# Patient Record
Sex: Female | Born: 1980 | Race: White | Hispanic: No | Marital: Married | Smoking: Never smoker
Health system: Southern US, Community
[De-identification: ages and names within clinical notes are randomized; demographics above are authoritative.]

## PROBLEM LIST (undated history)

## (undated) DIAGNOSIS — K5790 Diverticulosis of intestine, part unspecified, without perforation or abscess without bleeding: Secondary | ICD-10-CM

## (undated) DIAGNOSIS — G43909 Migraine, unspecified, not intractable, without status migrainosus: Secondary | ICD-10-CM

## (undated) DIAGNOSIS — J329 Chronic sinusitis, unspecified: Secondary | ICD-10-CM

## (undated) DIAGNOSIS — K219 Gastro-esophageal reflux disease without esophagitis: Secondary | ICD-10-CM

## (undated) DIAGNOSIS — F909 Attention-deficit hyperactivity disorder, unspecified type: Secondary | ICD-10-CM

## (undated) HISTORY — DX: Attention-deficit hyperactivity disorder, unspecified type: F90.9

## (undated) HISTORY — DX: Gastro-esophageal reflux disease without esophagitis: K21.9

## (undated) HISTORY — DX: Migraine, unspecified, not intractable, without status migrainosus: G43.909

## (undated) HISTORY — DX: Diverticulosis of intestine, part unspecified, without perforation or abscess without bleeding: K57.90

## (undated) HISTORY — PX: SINUS IRRIGATION: SHX2411

## (undated) HISTORY — PX: ABLATION: SHX5711

## (undated) HISTORY — PX: NASAL SEPTUM SURGERY: SHX37

## (undated) HISTORY — PX: REFRACTIVE SURGERY: SHX103

## (undated) HISTORY — DX: Chronic sinusitis, unspecified: J32.9

---

## 2004-04-12 ENCOUNTER — Ambulatory Visit: Payer: Self-pay | Admitting: Endocrinology

## 2004-04-15 ENCOUNTER — Ambulatory Visit: Payer: Self-pay | Admitting: Internal Medicine

## 2004-05-15 ENCOUNTER — Ambulatory Visit: Payer: Self-pay | Admitting: Internal Medicine

## 2004-06-12 ENCOUNTER — Ambulatory Visit: Payer: Self-pay | Admitting: Internal Medicine

## 2004-06-19 ENCOUNTER — Other Ambulatory Visit: Admission: RE | Admit: 2004-06-19 | Discharge: 2004-06-19 | Payer: Self-pay | Admitting: Gynecology

## 2004-06-27 ENCOUNTER — Ambulatory Visit: Payer: Self-pay | Admitting: Internal Medicine

## 2004-06-28 ENCOUNTER — Encounter: Admission: RE | Admit: 2004-06-28 | Discharge: 2004-06-28 | Payer: Self-pay | Admitting: Internal Medicine

## 2004-07-01 ENCOUNTER — Ambulatory Visit: Payer: Self-pay | Admitting: Internal Medicine

## 2004-07-08 ENCOUNTER — Ambulatory Visit: Payer: Self-pay | Admitting: Internal Medicine

## 2004-12-31 ENCOUNTER — Ambulatory Visit: Payer: Self-pay | Admitting: Endocrinology

## 2005-07-08 ENCOUNTER — Other Ambulatory Visit: Admission: RE | Admit: 2005-07-08 | Discharge: 2005-07-08 | Payer: Self-pay | Admitting: Gynecology

## 2006-07-10 ENCOUNTER — Other Ambulatory Visit: Admission: RE | Admit: 2006-07-10 | Discharge: 2006-07-10 | Payer: Self-pay | Admitting: Gynecology

## 2006-09-13 ENCOUNTER — Emergency Department (HOSPITAL_COMMUNITY): Admission: EM | Admit: 2006-09-13 | Discharge: 2006-09-13 | Payer: Self-pay | Admitting: *Deleted

## 2006-09-13 ENCOUNTER — Emergency Department (HOSPITAL_COMMUNITY): Admission: EM | Admit: 2006-09-13 | Discharge: 2006-09-14 | Payer: Self-pay | Admitting: Emergency Medicine

## 2006-12-12 ENCOUNTER — Emergency Department (HOSPITAL_COMMUNITY): Admission: EM | Admit: 2006-12-12 | Discharge: 2006-12-12 | Payer: Self-pay | Admitting: Emergency Medicine

## 2007-03-18 ENCOUNTER — Inpatient Hospital Stay (HOSPITAL_COMMUNITY): Admission: AD | Admit: 2007-03-18 | Discharge: 2007-03-18 | Payer: Self-pay | Admitting: Obstetrics

## 2007-07-07 ENCOUNTER — Inpatient Hospital Stay (HOSPITAL_COMMUNITY): Admission: AD | Admit: 2007-07-07 | Discharge: 2007-07-10 | Payer: Self-pay | Admitting: *Deleted

## 2007-09-05 ENCOUNTER — Emergency Department (HOSPITAL_COMMUNITY): Admission: EM | Admit: 2007-09-05 | Discharge: 2007-09-05 | Payer: Self-pay | Admitting: Emergency Medicine

## 2009-04-19 ENCOUNTER — Inpatient Hospital Stay (HOSPITAL_COMMUNITY): Admission: AD | Admit: 2009-04-19 | Discharge: 2009-04-19 | Payer: Self-pay | Admitting: Obstetrics and Gynecology

## 2009-04-23 ENCOUNTER — Inpatient Hospital Stay (HOSPITAL_COMMUNITY): Admission: AD | Admit: 2009-04-23 | Discharge: 2009-04-25 | Payer: Self-pay | Admitting: Obstetrics & Gynecology

## 2009-05-06 ENCOUNTER — Inpatient Hospital Stay (HOSPITAL_COMMUNITY): Admission: AD | Admit: 2009-05-06 | Discharge: 2009-05-06 | Payer: Self-pay | Admitting: Obstetrics & Gynecology

## 2010-09-11 LAB — RPR: RPR Ser Ql: NONREACTIVE

## 2010-09-11 LAB — RH IMMUNE GLOB WKUP(>/=20WKS)(NOT WOMEN'S HOSP): Fetal Screen: NEGATIVE

## 2010-09-11 LAB — CBC
HCT: 34 % — ABNORMAL LOW (ref 36.0–46.0)
HCT: 36.2 % (ref 36.0–46.0)
Hemoglobin: 11.5 g/dL — ABNORMAL LOW (ref 12.0–15.0)
Hemoglobin: 12.2 g/dL (ref 12.0–15.0)
MCHC: 33.7 g/dL (ref 30.0–36.0)
MCHC: 33.7 g/dL (ref 30.0–36.0)
MCV: 91.8 fL (ref 78.0–100.0)
MCV: 92.9 fL (ref 78.0–100.0)
Platelets: 184 10*3/uL (ref 150–400)
Platelets: 173 10*3/uL (ref 150–400)
RBC: 3.71 MIL/uL — ABNORMAL LOW (ref 3.87–5.11)
RBC: 3.9 MIL/uL (ref 3.87–5.11)
RDW: 14.3 % (ref 11.5–15.5)
RDW: 14 % (ref 11.5–15.5)
WBC: 13.1 10*3/uL — ABNORMAL HIGH (ref 4.0–10.5)
WBC: 11.8 10*3/uL — ABNORMAL HIGH (ref 4.0–10.5)

## 2010-10-22 NOTE — H&P (Signed)
NAMESHERBY, MONCAYO                ACCOUNT NO.:  [id number]   MEDICAL RECORD NO.:  [id number]          PATIENT TYPE:  MAT   LOCATION:  [facility]                          FACILITY:  [facility]   PHYSICIAN:  Lenoard Aden, M.D.DATE OF BIRTH:  11-17-1980   DATE OF ADMISSION:  DATE OF DISCHARGE:                              HISTORY & PHYSICAL   CHIEF COMPLAINT:  Post dates, for cervical ripening and induction.   Patient is a 30 year old white female, G1, P0, at [redacted] weeks gestation for  cervical ripening and induction.   She has no known latex allergy.  She is allergic to PENICILLIN.   She is a nonsmoker, nondrinker.  Denies domestic or physical violence.   History of medications to include prenatal vitamins and Zegerid.   FAMILY HISTORY:  Hyperthyroidism, migraine.  A personal history of  migraine headaches and gastroesophageal reflux, otherwise uncomplicated.   PHYSICAL EXAMINATION:  She is a well-developed and well-nourished white  female in no acute distress.  HEENT:  Normal.  LUNGS:  Clear.  HEART:  Regular rate and rhythm.  ABDOMEN:  Soft.  Gravid.  Nontender.  Estimated fetal weight 7-1/2  pounds.  PELVIC:  Cervix is 2-3 cm, 70% vertex, -1.  EXTREMITIES:  No cords.  NEUROLOGIC:  Nonfocal.  SKIN:  Intact.   IMPRESSION:  A 41-week intrauterine pregnancy, for cervical ripening and  induction.   PLAN:  Placement of Cervidil.  This is reactive.  NST anticipated,  vaginal delivery.      Lenoard Aden, M.D.  Electronically Signed     RJT/MEDQ  D:  07/07/2007  T:  07/07/2007  Job:  [id number]

## 2011-02-27 LAB — CBC
HCT: 32.1 — ABNORMAL LOW
HCT: 38.7
Hemoglobin: 11.1 — ABNORMAL LOW
Hemoglobin: 13.3
MCHC: 34.4
MCHC: 34.4
MCV: 91
MCV: 92.6
Platelets: 165
Platelets: 202
RBC: 3.47 — ABNORMAL LOW
RBC: 4.25
RDW: 14.2
RDW: 14.4
WBC: 11.5 — ABNORMAL HIGH
WBC: 12.6 — ABNORMAL HIGH

## 2011-02-27 LAB — RH IMMUNE GLOB WKUP(>/=20WKS)(NOT WOMEN'S HOSP): Fetal Screen: NEGATIVE

## 2011-02-27 LAB — RPR: RPR Ser Ql: NONREACTIVE

## 2011-03-18 ENCOUNTER — Encounter: Payer: Self-pay | Admitting: Cardiology

## 2011-03-19 ENCOUNTER — Encounter: Payer: Self-pay | Admitting: Cardiology

## 2011-03-19 ENCOUNTER — Ambulatory Visit (INDEPENDENT_AMBULATORY_CARE_PROVIDER_SITE_OTHER): Payer: 59 | Admitting: Cardiology

## 2011-03-19 DIAGNOSIS — R079 Chest pain, unspecified: Secondary | ICD-10-CM | POA: Insufficient documentation

## 2011-03-19 DIAGNOSIS — R0989 Other specified symptoms and signs involving the circulatory and respiratory systems: Secondary | ICD-10-CM

## 2011-03-19 DIAGNOSIS — R072 Precordial pain: Secondary | ICD-10-CM

## 2011-03-19 DIAGNOSIS — R0609 Other forms of dyspnea: Secondary | ICD-10-CM

## 2011-03-19 DIAGNOSIS — J45998 Other asthma: Secondary | ICD-10-CM | POA: Insufficient documentation

## 2011-03-19 DIAGNOSIS — R0600 Dyspnea, unspecified: Secondary | ICD-10-CM

## 2011-03-19 NOTE — Patient Instructions (Signed)
Your physician has requested that you have an echocardiogram. Echocardiography is a painless test that uses sound waves to create images of your heart. It provides your doctor with information about the size and shape of your heart and how well your heart's chambers and valves are working. This procedure takes approximately one hour. There are no restrictions for this procedure.   

## 2011-03-19 NOTE — Progress Notes (Signed)
HPI: 30 year old female with no prior cardiac history for evaluation for chest pain. Patient states that for the past one month she has had increased dyspnea on exertion. No orthopnea, PND or pedal edema. She also notes chest pain. It is substernal in location with no associated symptoms. It can last hours to 2 days. It is described as an ache. It increases with inspiration and cough. It is not exertional. No relation to food. Resolved spontaneously. She has been seen and diagnosed with asthma. We are asked to evaluate for potential cardiac contribution.  Current Outpatient Prescriptions  Medication Sig Dispense Refill  . cholecalciferol (VITAMIN D) 1000 UNITS tablet Take 1,000 Units by mouth daily.        . Multiple Vitamin (MULTIVITAMIN) capsule Take 1 capsule by mouth daily.        Marland Kitchen PROAIR HFA 108 (90 BASE) MCG/ACT inhaler as needed.      . Probiotic Product (PROBIOTIC FORMULA PO) Take 1 tablet by mouth daily.          Allergies  Allergen Reactions  . Penicillins     Past Medical History  Diagnosis Date  . Asthma   . GERD (gastroesophageal reflux disease)     No past surgical history on file.  History   Social History  . Marital Status: Married    Spouse Name: N/A    Number of Children: 2  . 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 Smoking status: Never Smoker   . Smokeless tobacco: Not on file  . Alcohol Use: Yes     Rarely  . Drug Use: No  . Sexually Active: Not on file   Other Topics Concern  . Not on file   Social History Narrative  . No narrative on file    Family History  Problem Relation Age of Onset  . Hypertension      ROS: no fevers or chills, productive cough, hemoptysis, dysphasia, odynophagia, melena, hematochezia, dysuria, hematuria, rash, seizure activity, orthopnea, PND, pedal edema, claudication. Remaining systems are negative.  Physical Exam: General:   Well developed/well nourished in NAD Skin warm/dry Patient not depressed No peripheral clubbing Back-normal HEENT-normal/normal eyelids Neck supple/normal carotid upstroke bilaterally; no bruits; no JVD; no thyromegaly chest - diffuse expiratory wheeze CV - RRR/normal S1 and S2; no murmurs, rubs or gallops;  PMI nondisplaced; no change with valsalva. Abdomen -NT/ND, no HSM, no mass, + bowel sounds, no bruit 2+ femoral pulses, no bruits Ext-no edema, chords, 2+ DP Neuro-grossly nonfocal  ECG 03/17/11 NSR with no ST changes.

## 2011-03-19 NOTE — Assessment & Plan Note (Signed)
Patient with expiratory wheeze on examination. Dyspnea most likely secondary to asthma. Management per primary care.

## 2011-03-19 NOTE — Assessment & Plan Note (Addendum)
Patient's symptoms are atypical and unlikely to be cardiac related. Her electrocardiogram is normal. Her chest pain can last 2 days at a time. Her chest tightness may be related to her asthma. Will arrange echocardiogram to assess LV function. If normal no further cardiac workup. Note no risk factors for pulmonary embolus including no recent travel or leg trauma.

## 2011-03-20 LAB — URINALYSIS, ROUTINE W REFLEX MICROSCOPIC
Bilirubin Urine: NEGATIVE
Glucose, UA: NEGATIVE
Hgb urine dipstick: NEGATIVE
Ketones, ur: NEGATIVE
Nitrite: NEGATIVE
Protein, ur: NEGATIVE
Specific Gravity, Urine: 1.02
Urobilinogen, UA: 0.2
pH: 6.5

## 2011-03-20 LAB — URINE MICROSCOPIC-ADD ON

## 2011-03-20 LAB — WET PREP, GENITAL
Clue Cells Wet Prep HPF POC: NONE SEEN
Trich, Wet Prep: NONE SEEN
Yeast Wet Prep HPF POC: NONE SEEN

## 2011-03-20 LAB — CBC
HCT: 33.8 — ABNORMAL LOW
Hemoglobin: 11.8 — ABNORMAL LOW
MCHC: 35
MCV: 90.8
Platelets: 222
RBC: 3.73 — ABNORMAL LOW
RDW: 13.6
WBC: 12 — ABNORMAL HIGH

## 2011-03-20 LAB — BASIC METABOLIC PANEL
BUN: 4 — ABNORMAL LOW
CO2: 27
Calcium: 9.1
Chloride: 105
Creatinine, Ser: 0.49
GFR calc Af Amer: >60
GFR calc non Af Amer: >60
Glucose, Bld: 88
Potassium: 3.8
Sodium: 137

## 2011-03-20 LAB — URINE CULTURE: Colony Count: 9000

## 2011-03-25 ENCOUNTER — Telehealth: Payer: Self-pay | Admitting: Cardiology

## 2011-03-25 NOTE — Telephone Encounter (Signed)
Patient called to cancelled the echo. Echo is off schedule per Melissa.

## 2011-03-25 NOTE — Telephone Encounter (Signed)
Pt called to cxl echo

## 2011-03-26 ENCOUNTER — Other Ambulatory Visit (HOSPITAL_COMMUNITY): Payer: 59 | Admitting: Radiology

## 2011-04-03 ENCOUNTER — Ambulatory Visit (INDEPENDENT_AMBULATORY_CARE_PROVIDER_SITE_OTHER): Payer: 59 | Admitting: Internal Medicine

## 2011-04-03 ENCOUNTER — Encounter: Payer: Self-pay | Admitting: Internal Medicine

## 2011-04-03 VITALS — BP 110/82 | HR 76 | Temp 98.0°F | Ht 66.0 in | Wt 136.0 lb

## 2011-04-03 DIAGNOSIS — R0609 Other forms of dyspnea: Secondary | ICD-10-CM

## 2011-04-03 DIAGNOSIS — R0600 Dyspnea, unspecified: Secondary | ICD-10-CM

## 2011-04-03 DIAGNOSIS — R0989 Other specified symptoms and signs involving the circulatory and respiratory systems: Secondary | ICD-10-CM

## 2011-04-03 MED ORDER — ESOMEPRAZOLE MAGNESIUM 40 MG PO CPDR: 40.0000 mg | DELAYED_RELEASE_CAPSULE | Freq: Every day | ORAL | Status: DC

## 2011-04-03 NOTE — Patient Instructions (Signed)
Work on inhaler technique:  relax and gently blow all the way out then take a nice smooth deep breath back in, triggering the inhaler at same time you start breathing in.  Hold for up to 5 seconds if you can.  Rinse and gargle with water when done   If your mouth or throat starts to bother you,   I suggest you time the inhaler to your dental care and after using the inhaler(s) brush teeth and tongue with a baking soda containing toothpaste and when you rinse this out, gargle with it first to see if this helps your mouth and throat.     Add nexium Take 30-60 min before first meal of the day and continue zegrid at bedtime  GERD (REFLUX)  is an extremely common cause of respiratory symptoms, many times with no significant heartburn at all.    It can be treated with medication, but also with lifestyle changes including avoidance of late meals, excessive alcohol, smoking cessation, and avoid fatty foods, chocolate, peppermint, colas, red wine, and acidic juices such as orange juice.  NO MINT OR MENTHOL PRODUCTS SO NO COUGH DROPS  USE SUGARLESS CANDY INSTEAD (jolley ranchers or Stover's)  NO OIL BASED VITAMINS - use powdered substitutes.    Please see patient coordinator before you leave today  to schedule methacholine test

## 2011-04-03 NOTE — Progress Notes (Signed)
  Subjective:    Patient ID: Rachel Ferrell, female    DOB: June 08, 1981, 30 y.o.   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 Cc new onset variably exertional but sometimes resting dyspnea x 3 months indolent onset, mod severe and now persistent daily  assoc with chest tightness worse with activity  but partially improved p proaire eval by Cyndia Bent then cards >  ok by Dr Antoine Poche.  c/o draingage sensation  is year round worse in am's but doesn't wake her up on Zegrid at hs only.  Only very small amt green mucus ever produced, mostly first thing  in am.   Sleeping ok without nocturnal  or premature am awakening or  exacerbation  of respiratory  c/o's or need for noct saba. Also denies any obvious fluctuation of symptoms with weather or environmental changes or other aggravating or alleviating factors except as outlined above   Review of Systems  Constitutional: Negative for fever, chills and unexpected weight change.  HENT: Positive for sore throat. Negative for ear pain, nosebleeds, congestion, rhinorrhea, sneezing, trouble swallowing, dental problem, voice change, postnasal drip and sinus pressure.   Eyes: Negative for visual disturbance.  Respiratory: Positive for cough and shortness of breath. Negative for choking.   Cardiovascular: Positive for chest pain. Negative for leg swelling.  Gastrointestinal: Negative for vomiting, abdominal pain and diarrhea.  Genitourinary: Negative for difficulty urinating.  Musculoskeletal: Negative for arthralgias.  Skin: Negative for rash.  Neurological: Positive for  headaches. Negative for tremors and syncope.  Hematological: Does not bruise/bleed easily.       Objective:   Physical Exam  amb wf nad  Wt 136 04/03/2011  HEENT: nl dentition, turbinates, and orophanx. Nl external ear canals without cough reflex   NECK :  without JVD/Nodes/TM/ nl carotid upstrokes bilaterally   LUNGS: no acc muscle use, clear to A and P bilaterally without cough on insp or exp maneuvers   CV:  RRR  no s3 or murmur or increase in P2, no edema   ABD:  soft and nontender with nl excursion in the supine position. No bruits or organomegaly, bowel sounds nl  MS:  warm without deformities, calf tenderness, cyanosis or clubbing  SKIN: warm and dry without lesions    NEURO:  alert, approp, no deficits        Assessment & Plan:

## 2011-04-03 NOTE — Assessment & Plan Note (Signed)
DDX of  difficult airways managment all start with A and  include Adherence, Ace Inhibitors, Acid Reflux, Active Sinus Disease, Alpha 1 Antitripsin deficiency, Anxiety masquerading as Airways dz,  ABPA,  allergy(esp in young), Aspiration (esp in elderly), Adverse effects of DPI,  Active smokers, plus two Bs  = Bronchiectasis and Beta blocker use..and one C= CHF  Acid and non acid reflux remain at the top of the list for her as she had it before with net ent eval x for this.  Discussed max acid suppression.  Explained natural history of uri and why it's necessary in patients at risk to treat GERD aggressively  at least  short term   to reduce risk of evolving cyclical cough initially  triggered by epithelial injury and a heightened sensitivty to the effects of any upper airway irritants,  most importantly acid - related.  That is, the more sensitive the epithelium damaged for virus, the more the cough, the more the secondary reflux (especially in those prone to reflux) the more the irritation of the sensitive mucosa and so on in a cyclical pattern.   Next step is methacholine challenge then perhaps add 1st gen H1 per guidelines  The proper method of use, as well as anticipated side effects, of this metered-dose inhaler are discussed and demonstrated to the patient. Improved only to 25% with coaching so not a great candidate for trial of hfa ICS and likely will cough worse with DPI so need to sort out whether astma really present or not before proceeding further.

## 2011-04-08 ENCOUNTER — Telehealth: Payer: Self-pay | Admitting: Internal Medicine

## 2011-04-08 NOTE — Telephone Encounter (Signed)
nexium is not covered by insurance. What alternative will work for the pt?  She is currently on zegerid at bedtime.  Please advise. Carron Curie, CMA

## 2011-04-08 NOTE — Telephone Encounter (Signed)
zegrid is fine but needs to be dosed 40 mg twice daily or generic protonix 40 mg can be substituted for the am nexim

## 2011-04-09 NOTE — Telephone Encounter (Signed)
Called and spoke with pharmacist and advised of recs per Dr Sherene Sires. She tried running both pantoprazole and zegerid 40 bid and also omeprazole. She states that she is going to refax PA to triage faxline so we can try and get nexium approved. Will await fax.

## 2011-04-10 MED ORDER — PANTOPRAZOLE SODIUM 40 MG PO TBEC: DELAYED_RELEASE_TABLET | ORAL | Status: DC

## 2011-04-10 NOTE — Telephone Encounter (Signed)
Yes, ok to use zegrid if 100% better to her satisfaction

## 2011-04-10 NOTE — Telephone Encounter (Signed)
Received PA form from [employer]([phone]- called to initiate PA form nexium. Spoke with Amy and was advised that nexium is not covered but also does not require a PA. She states that pantoprazole and omeprazole are covered for once day dosing. I will call in rx for pantoprazole 40 mg every am per MW recs. Spoke with pt and advised of this and she states that she has actually been taking her otc 20 mg zegerid in the am and pm and this has helped a lot so wants to know if okay to just continue on this. Please advise, thanks!

## 2011-04-10 NOTE — Telephone Encounter (Signed)
Spoke with pt and notified of recs per MW. She states that after thinking about it, taking zegerid otc bid may become too expensive and so therefore does want the rx for pantoprazole sent. Rx was sent to pharm.

## 2011-04-16 ENCOUNTER — Other Ambulatory Visit (HOSPITAL_COMMUNITY): Payer: Self-pay | Admitting: Radiology

## 2011-04-17 ENCOUNTER — Other Ambulatory Visit (HOSPITAL_COMMUNITY): Payer: Self-pay | Admitting: Radiology

## 2011-04-17 ENCOUNTER — Ambulatory Visit (HOSPITAL_COMMUNITY)
Admission: RE | Admit: 2011-04-17 | Discharge: 2011-04-17 | Disposition: A | Discharge status: Home / Self Care | Payer: 59 | Source: Ambulatory Visit | Attending: Internal Medicine | Admitting: Internal Medicine

## 2011-04-17 DIAGNOSIS — R0602 Shortness of breath: Secondary | ICD-10-CM | POA: Insufficient documentation

## 2011-04-17 DIAGNOSIS — R0600 Dyspnea, unspecified: Secondary | ICD-10-CM

## 2011-04-17 LAB — PULMONARY FUNCTION TEST

## 2011-04-17 MED ORDER — METHACHOLINE 16 MG/ML NEB SOLN: 3.0000 mL | Freq: Once | RESPIRATORY_TRACT | Status: DC

## 2011-04-17 MED ORDER — METHACHOLINE 0.25 MG/ML NEB SOLN: 3.0000 mL | Freq: Once | RESPIRATORY_TRACT | Status: DC

## 2011-04-17 MED ORDER — SODIUM CHLORIDE 0.9 % IN NEBU
3.0000 mL | INHALATION_SOLUTION | Freq: Once | RESPIRATORY_TRACT | Status: AC
  Administered 2011-04-17: 3 mL via RESPIRATORY_TRACT

## 2011-04-17 MED ORDER — METHACHOLINE 1 MG/ML NEB SOLN: 3.0000 mL | Freq: Once | RESPIRATORY_TRACT | Status: DC

## 2011-04-17 MED ORDER — ALBUTEROL SULFATE (5 MG/ML) 0.5% IN NEBU: 2.5000 mg | INHALATION_SOLUTION | Freq: Once | RESPIRATORY_TRACT | Status: DC

## 2011-04-17 MED ORDER — METHACHOLINE 0.0625 MG/ML NEB SOLN: 3.0000 mL | Freq: Once | RESPIRATORY_TRACT | Status: DC

## 2011-04-17 MED ORDER — METHACHOLINE 4 MG/ML NEB SOLN: 3.0000 mL | Freq: Once | RESPIRATORY_TRACT | Status: DC

## 2011-04-17 NOTE — Progress Notes (Signed)
Pt. Given methacholine pre protocol. Unable to chart and scan against due to not being in [facility].

## 2011-04-23 MED FILL — Methacholine Chloride Inhal For Soln 100 MG: RESPIRATORY_TRACT | Qty: 2 | Status: AC

## 2011-04-23 MED FILL — Methacholine Chloride Inhal For Soln 100 MG: RESPIRATORY_TRACT | Qty: 100 | Status: AC

## 2011-05-02 ENCOUNTER — Ambulatory Visit (HOSPITAL_COMMUNITY): Payer: 59 | Attending: Cardiology | Admitting: Radiology

## 2011-05-02 DIAGNOSIS — R072 Precordial pain: Secondary | ICD-10-CM

## 2011-05-02 DIAGNOSIS — R0989 Other specified symptoms and signs involving the circulatory and respiratory systems: Secondary | ICD-10-CM

## 2011-05-02 DIAGNOSIS — I079 Rheumatic tricuspid valve disease, unspecified: Secondary | ICD-10-CM | POA: Insufficient documentation

## 2011-05-02 DIAGNOSIS — R0609 Other forms of dyspnea: Secondary | ICD-10-CM | POA: Insufficient documentation

## 2011-05-02 DIAGNOSIS — I379 Nonrheumatic pulmonary valve disorder, unspecified: Secondary | ICD-10-CM | POA: Insufficient documentation

## 2011-05-02 DIAGNOSIS — I059 Rheumatic mitral valve disease, unspecified: Secondary | ICD-10-CM | POA: Insufficient documentation

## 2011-05-04 ENCOUNTER — Encounter (HOSPITAL_COMMUNITY): Payer: Self-pay | Admitting: *Deleted

## 2011-05-04 ENCOUNTER — Emergency Department (HOSPITAL_COMMUNITY)
Admission: EM | Admit: 2011-05-04 | Discharge: 2011-05-04 | Disposition: A | Discharge status: Home / Self Care | Payer: 59 | Attending: Emergency Medicine | Admitting: Emergency Medicine

## 2011-05-04 ENCOUNTER — Emergency Department (HOSPITAL_COMMUNITY): Payer: 59

## 2011-05-04 DIAGNOSIS — J45909 Unspecified asthma, uncomplicated: Secondary | ICD-10-CM | POA: Insufficient documentation

## 2011-05-04 DIAGNOSIS — R0602 Shortness of breath: Secondary | ICD-10-CM | POA: Insufficient documentation

## 2011-05-04 DIAGNOSIS — R079 Chest pain, unspecified: Secondary | ICD-10-CM | POA: Insufficient documentation

## 2011-05-04 MED ORDER — PREDNISONE 20 MG PO TABS: 60.0000 mg | ORAL_TABLET | Freq: Every day | ORAL | Status: AC

## 2011-05-04 MED ORDER — ALBUTEROL SULFATE (5 MG/ML) 0.5% IN NEBU
5.0000 mg | INHALATION_SOLUTION | Freq: Once | RESPIRATORY_TRACT | Status: AC
  Administered 2011-05-04: 5 mg via RESPIRATORY_TRACT

## 2011-05-04 MED ORDER — ACETAMINOPHEN-CODEINE 120-12 MG/5ML PO SUSP: 5.0000 mL | Freq: Four times a day (QID) | ORAL | Status: AC | PRN

## 2011-05-04 MED ORDER — PREDNISONE 20 MG PO TABS
60.0000 mg | ORAL_TABLET | Freq: Once | ORAL | Status: AC
  Administered 2011-05-04: 60 mg via ORAL
  Filled 2011-05-04: qty 3

## 2011-05-04 MED ORDER — ALBUTEROL SULFATE (5 MG/ML) 0.5% IN NEBU
2.5000 mg | INHALATION_SOLUTION | Freq: Once | RESPIRATORY_TRACT | Status: DC
  Filled 2011-05-04: qty 1

## 2011-05-04 MED ORDER — ALBUTEROL SULFATE (5 MG/ML) 0.5% IN NEBU
2.5000 mg | INHALATION_SOLUTION | Freq: Once | RESPIRATORY_TRACT | Status: AC
  Administered 2011-05-04: 2.5 mg via RESPIRATORY_TRACT
  Filled 2011-05-04: qty 0.5

## 2011-05-04 NOTE — ED Provider Notes (Signed)
History     CSN: [id number] Arrival date & time: 05/04/2011 12:50 AM   First MD Initiated Contact with Patient 05/04/11 0126      Chief Complaint  Patient presents with  . Cough  . Shortness of Breath    (Consider location/radiation/quality/duration/timing/severity/associated sxs/prior treatment) Patient is a 30 y.o. female presenting with cough and shortness of breath. The history is provided by the patient.  Cough This is a new problem. The current episode started 3 to 5 hours ago. The problem occurs constantly. The problem has been gradually worsening. The cough is non-productive. There has been no fever. Associated symptoms include chest pain, shortness of breath and wheezing. Pertinent negatives include no chills, no ear congestion, no ear pain, no headaches and no eye redness. Treatments tried: Albuterol inhaler. The treatment provided mild relief.  Shortness of Breath  Associated symptoms include chest pain, cough, shortness of breath and wheezing. Pertinent negatives include no fever.   patient with history of asthma followed by Dr. Sherene Sires with pulmonary. She also has reflux disease that she believes may be attributing to her symptoms. Today she is having worsening shortness of breath and wheezing with chest burning and tightness. No fevers or chills. No known sick contacts. No recent travel. No change in medications. Moderate in severity. No aggravating or alleviating factors otherwise.  Past Medical History  Diagnosis Date  . Asthma   . GERD (gastroesophageal reflux disease)   . GERD (gastroesophageal reflux disease)     History reviewed. No pertinent past surgical history.  Family History  Problem Relation Age of Onset  . Hypertension      History  Substance Use Topics  . Smoking status: Never Smoker   . Smokeless tobacco: Never Used  . Alcohol Use: No    OB History    Grav Para Term Preterm Abortions TAB SAB Ect Mult Living                  Review of  Systems  Constitutional: Negative for fever and chills.  HENT: Negative for ear pain, neck pain and neck stiffness.   Eyes: Negative for pain and redness.  Respiratory: Positive for cough, shortness of breath and wheezing.   Cardiovascular: Positive for chest pain.  Gastrointestinal: Negative for abdominal pain.  Genitourinary: Negative for dysuria.  Musculoskeletal: Negative for back pain.  Skin: Negative for rash.  Neurological: Negative for headaches.  All other systems reviewed and are negative.    Allergies  Penicillins  Home Medications   Current Outpatient Rx  Name Route Sig Dispense Refill  . VITAMIN D 1000 UNITS PO TABS Oral Take 1,000 Units by mouth daily.      . MULTIVITAMINS PO CAPS Oral Take 1 capsule by mouth daily.      Marland Kitchen OMEPRAZOLE-SODIUM BICARBONATE 20-1100 MG PO CAPS Oral Take 1 capsule by mouth at bedtime.      Marland Kitchen PROAIR HFA 108 (90 BASE) MCG/ACT IN AERS  2 puffs every 6 (six) hours as needed.     Marland Kitchen PROBIOTIC FORMULA PO Oral Take 1 tablet by mouth daily.      Marland Kitchen ESOMEPRAZOLE MAGNESIUM 40 MG PO CPDR Oral Take 1 capsule (40 mg total) by mouth daily. 30 capsule 1  . PANTOPRAZOLE SODIUM 40 MG PO TBEC  Take 1 tablet by mouth 30-60 minutes before first meal of the day 30 tablet 5    BP 104/62  Pulse 61  Temp(Src) 98.2 F (36.8 C) (Oral)  Resp 16  SpO2 100%  LMP 04/13/2011  Physical Exam  Constitutional: She is oriented to person, place, and time. She appears well-developed and well-nourished.  HENT:  Head: Normocephalic and atraumatic.  Eyes: Conjunctivae and EOM are normal. Pupils are equal, round, and reactive to light.  Neck: Trachea normal. Neck supple. No thyromegaly present.  Cardiovascular: Normal rate, regular rhythm, S1 normal, S2 normal and normal pulses.     No systolic murmur is present   No diastolic murmur is present  Pulses:      Radial pulses are 2+ on the right side, and 2+ on the left side.  Pulmonary/Chest: Effort normal and breath  sounds normal. She has no rhonchi.       No respiratory distress, has mild expiratory wheezes with good air movement  Abdominal: Soft. Normal appearance and bowel sounds are normal. There is no tenderness. There is no CVA tenderness and negative Murphy's sign.  Musculoskeletal:       BLE:s Calves nontender, no cords or erythema, negative Homans sign  Neurological: She is alert and oriented to person, place, and time. She has normal strength. No cranial nerve deficit or sensory deficit. GCS eye subscore is 4. GCS verbal subscore is 5. GCS motor subscore is 6.  Skin: Skin is warm and dry. No rash noted. She is not diaphoretic.  Psychiatric: Her speech is normal.       Cooperative and appropriate    ED Course  Procedures (including critical care time)  Labs Reviewed - No data to display Dg Chest 2 View  05/04/2011  *RADIOLOGY REPORT*  Clinical Data: Shortness of breath and cough; history of asthma.  CHEST - 2 VIEW  Comparison: None.  Findings: The lungs are well-aerated.  Peribronchial thickening is noted.  There is no evidence of focal opacification, pleural effusion or pneumothorax.  The heart is normal in size; the mediastinal contour is within normal limits.  No acute osseous abnormalities are seen.  IMPRESSION: Peribronchial thickening noted; lungs otherwise clear.  Original Report Authenticated By: Tonia Ghent, M.D.    By mouth prednisone and albuterol nebulizer provided. On recheck still has some mild wheezing and repeat albuterol nebulizers given. Recheck at 4:45 AM patient to much better is requesting discharge home. She has close followup with Dr. Sherene Sires agrees to strict return precautions and followup instructions   MDM   Wheezing with history of asthma improved with albuterol nebulizers and prednisone. Chest x-ray reviewed as above. Symptoms may be exacerbated by mild URI symptoms. Tylenol with codeine prescribed in addition to prednisone. Patient has an annular at home and is  stable for discharge        Sunnie Nielsen, MD 05/04/11 [phone]

## 2011-05-07 ENCOUNTER — Telehealth: Payer: Self-pay | Admitting: Internal Medicine

## 2011-05-07 DIAGNOSIS — R0600 Dyspnea, unspecified: Secondary | ICD-10-CM

## 2011-05-07 NOTE — Telephone Encounter (Signed)
Verlon Au, can you please track down these results for MW.  Thanks.

## 2011-05-07 NOTE — Telephone Encounter (Signed)
Do not see the completed study in the emr

## 2011-05-07 NOTE — Telephone Encounter (Signed)
I spoke with pt and is requesting her Methacholine challenge results from 04/17/11. Please advise Dr. Sherene Sires, thanks

## 2011-05-07 NOTE — Telephone Encounter (Signed)
Called and LMOVM requesting that this study be faxed to triage. Will await results.

## 2011-05-08 NOTE — Telephone Encounter (Signed)
Received results and put in MW's look at stack

## 2011-05-09 NOTE — Telephone Encounter (Signed)
Positive for asthma so next step is qvar 40 rx (or we can give her samples of the 80 to try if we don't have the 40) and use Take 2 puffs first thing in am and then another 2 puffs about 12 hours later.    x 2 weeks then ov to regroup

## 2011-05-09 NOTE — Telephone Encounter (Signed)
Called and spoke with pt and notified of results of MCT. I advised will give samples of qvar 80 (no 40 available) and should take 2 puffs first thing in the am and then 2 puffs about 12 hours later, rinse mouth after using. Pt verbalized understanding and has sched 2 wk rov with MW 05/23/11 at 9 am.

## 2011-05-12 ENCOUNTER — Telehealth: Payer: Self-pay | Admitting: Internal Medicine

## 2011-05-12 NOTE — Telephone Encounter (Signed)
Spoke with pt. She states that she forgot to mention last wk that she had just finished prednisone taper so she did not start inhaler yet. I advised that it is fine for her to start the inhaler after taking the prednisone and she verbalized understanding.

## 2011-05-23 ENCOUNTER — Ambulatory Visit (INDEPENDENT_AMBULATORY_CARE_PROVIDER_SITE_OTHER): Payer: 59 | Admitting: Internal Medicine

## 2011-05-23 ENCOUNTER — Encounter: Payer: Self-pay | Admitting: Internal Medicine

## 2011-05-23 ENCOUNTER — Telehealth: Payer: Self-pay | Admitting: Internal Medicine

## 2011-05-23 VITALS — BP 90/60 | HR 69 | Temp 98.2°F | Ht 66.0 in | Wt 140.2 lb

## 2011-05-23 DIAGNOSIS — R0600 Dyspnea, unspecified: Secondary | ICD-10-CM

## 2011-05-23 DIAGNOSIS — J069 Acute upper respiratory infection, unspecified: Secondary | ICD-10-CM

## 2011-05-23 DIAGNOSIS — J45909 Unspecified asthma, uncomplicated: Secondary | ICD-10-CM

## 2011-05-23 DIAGNOSIS — R0989 Other specified symptoms and signs involving the circulatory and respiratory systems: Secondary | ICD-10-CM

## 2011-05-23 MED ORDER — ESOMEPRAZOLE MAGNESIUM 40 MG PO CPDR: 40.0000 mg | DELAYED_RELEASE_CAPSULE | Freq: Every day | ORAL | Status: DC

## 2011-05-23 MED ORDER — TRAMADOL HCL 50 MG PO TABS
ORAL_TABLET | ORAL | Status: AC
Start: 2011-05-23 — End: 2011-06-02

## 2011-05-23 MED ORDER — PREDNISONE (PAK) 10 MG PO TABS: ORAL_TABLET | ORAL | Status: AC

## 2011-05-23 MED ORDER — OMEPRAZOLE-SODIUM BICARBONATE 20-1100 MG PO CAPS: ORAL_CAPSULE | ORAL | Status: DC

## 2011-05-23 MED ORDER — DOXYCYCLINE HYCLATE 100 MG PO TABS: 100.0000 mg | ORAL_TABLET | Freq: Two times a day (BID) | ORAL | Status: AC

## 2011-05-23 NOTE — Progress Notes (Signed)
  Subjective:    Patient ID: Rachel Ferrell, female    DOB: August 09, 1980, 30 y.o.   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 Cc new onset variably exertional but sometimes resting dyspnea x 3 months indolent onset, mod severe and now persistent daily  assoc with chest tightness worse with activity  but partially improved p proaire eval by Cyndia Bent then cards >  ok by Dr Antoine Poche.  c/o draingage sensation  is year round worse in am's but doesn't wake her up on Zegrid at hs only.  Only very small amt green mucus ever produced, mostly first thing  in am.  rec Work on inhaler technique:      Add nexium Take 30-60 min before first meal of the day and continue zegrid at bedtime GERD rx then MCT >  POSITIVE FOR ASTHMA,  Felt uncomfortable with breathing but zero reproduction of symptoms.  Started qvar 80 one bid ( samples) and cp seemed better, cough got worse > to ER rx with prednisone but no benefit from albuterol either.   05/23/2011 f/u ov/Dayvian Blixt cc persistent cough present daily worse when lie down but better p nyquil then cough a lot for the first hour of each am.  No sob ? Better on qvar.  Also denies any obvious fluctuation of symptoms with weather or environmental changes or other aggravating or alleviating factors except as outlined above.  ROS  At present neg for  any significant sore throat, dysphagia, itching, sneezing,  nasal congestion or excess/ purulent secretions,  fever, chills, sweats, unintended wt loss, pleuritic or exertional cp, hempoptysis, orthopnea pnd or leg swelling.  Also denies presyncope,  palpitations, heartburn, abdominal pain, nausea, vomiting, diarrhea  or change in bowel or urinary habits, dysuria,hematuria,  rash, arthralgias, visual complaints, headache, numbness weakness or ataxia.          Objective:   Physical Exam  amb wf nad  Wt 136 04/03/2011 > 05/23/2011  140   HEENT: nl dentition, turbinates, and orophanx. Nl external ear canals without cough reflex   NECK :  without JVD/Nodes/TM/ nl carotid upstrokes bilaterally   LUNGS: no acc muscle use, clear to A and P bilaterally without cough on insp or exp maneuvers   CV:  RRR  no s3 or murmur or increase in P2, no edema   ABD:  soft and nontender with nl excursion in the supine position. No bruits or organomegaly, bowel sounds nl  MS:  warm without deformities, calf tenderness, cyanosis or clubbing   cxr 05/04/11 Peribronchial thickening noted; lungs otherwise clear.       Assessment & Plan:

## 2011-05-23 NOTE — Telephone Encounter (Signed)
Has enough samples of nexium until next ov and then can address the issue of whether or not ppi is needed

## 2011-05-23 NOTE — Telephone Encounter (Signed)
MW---pts insurance does not cover the nexium and pt is requesting rx for pantoprazole to be sent to the pharmacy.  Pt stated that this was discussed at ov with am.. Please advise if ok to send in rx for pt.  thanks

## 2011-05-23 NOTE — Patient Instructions (Addendum)
The key to effective treatment for your cough is eliminating the non-stop cycle of cough you're stuck in long enough to let your airway heal completely and then see if there is anything still making you cough once you stop the cough suppression, but this should take no more than 5 days to figuure out  First take delsym two tsp every 12 hours and supplement if needed with  tramadol 50 mg up to 2 every 4 hours to suppress the urge to cough. Swallowing water or using ice chips/non mint and menthol containing candies (such as lifesavers or sugarless jolly ranchers) are also effective.  You should rest your voice and avoid activities that you know make you cough.  Once you have eliminated the cough for 3 straight days try reducing the tramadol first,  then the delsym as tolerated.    Try Nexium 40mg    Take 30-60 min before first meal of the day and Zergerid 20 mg two bedtime until cough is completely gone for at least a week without the need for cough suppression  I think of reflux for chronic cough like I do oxygen for fire (doesn't cause the fire but once you get the oxygen suppressed it usually goes away regardless of the exact cause).  GERD (REFLUX)  is an extremely common cause of respiratory symptoms, many times with no significant heartburn at all.    It can be treated with medication, but also with lifestyle changes including avoidance of late meals, excessive alcohol, smoking cessation, and avoid fatty foods, chocolate, peppermint, colas, red wine, and acidic juices such as orange juice.  NO MINT OR MENTHOL PRODUCTS SO NO COUGH DROPS  USE SUGARLESS CANDY INSTEAD (jolley ranchers or Stover's)  NO OIL BASED VITAMINS - use powdered substitutes.   Doxy x 7 days  Prednisone 10 mg take  4 each am x 2 days,   2 each am x 2 days,  1 each am x2days and stop     Please schedule a follow up office visit in 4 weeks, sooner if needed   Next steps sinus ct and allergy eval

## 2011-05-24 DIAGNOSIS — J069 Acute upper respiratory infection, unspecified: Secondary | ICD-10-CM | POA: Insufficient documentation

## 2011-05-24 NOTE — Assessment & Plan Note (Signed)
DDX of  difficult airways managment all start with A and  include Adherence, Ace Inhibitors, Acid Reflux, Active Sinus Disease, Alpha 1 Antitripsin deficiency, Anxiety masquerading as Airways dz,  ABPA,  allergy(esp in young), Aspiration (esp in elderly), Adverse effects of DPI,  Active smokers, plus two Bs  = Bronchiectasis and Beta blocker use..and one C= CHF  Adherence is always the initial "prime suspect" and is a multilayered concern that requires a "trust but verify" approach in every patient - starting with knowing how to use medications, especially inhalers, correctly, keeping up with refills and understanding the fundamental difference between maintenance and prns vs those medications only taken for a very short course and then stopped and not refilled. The proper method of use, as well as anticipated side effects, of this metered-dose inhaler are discussed and demonstrated to the patient. Improved from 25 to 90% with coaching  ? Acid reflux > continue max rx for now  ? Active sinus dz > sinus ct next  ? Allergy> allergy profile next

## 2011-05-24 NOTE — Assessment & Plan Note (Signed)
rx with doxy,  Short course prednisone only

## 2011-06-05 ENCOUNTER — Telehealth: Payer: Self-pay | Admitting: Internal Medicine

## 2011-06-05 NOTE — Telephone Encounter (Signed)
Pt's insurance will not cover Nexium or Zegerid. The pt must first try and fail all OTC medications:  Zegerid OTC, Omeprazole (Prilosec), Prevacid. Pls advise. Allergies  Allergen Reactions  . Penicillins

## 2011-06-05 NOTE — Telephone Encounter (Signed)
Try prilosec 20 mg x 2 each am and zegrid 20 mg x 2 each pm until next ov

## 2011-06-05 NOTE — Telephone Encounter (Addendum)
See other phone note dated today 

## 2011-06-05 NOTE — Telephone Encounter (Signed)
lmomtcb  

## 2011-06-11 NOTE — Telephone Encounter (Signed)
LMOMTCB x 1 

## 2011-06-12 NOTE — Telephone Encounter (Signed)
Pt does not want to keep paying out of pocket for medications due to cost. She says her insurance coverage has changed for the new year and she wants to continue on Nexium (she has samples) and Zegerid OTC until she has OV on 06/20/11 with Dr. Sherene Sires. Will forward to MW so he is aware and he and pt can discuss PPI regimen at that OV.

## 2011-06-12 NOTE — Telephone Encounter (Signed)
Pt returned triage's call & asked to be reached at [phone].  Rachel Ferrell

## 2011-06-20 ENCOUNTER — Ambulatory Visit: Payer: 59 | Admitting: Internal Medicine

## 2012-04-30 ENCOUNTER — Other Ambulatory Visit: Payer: Self-pay | Admitting: Internal Medicine

## 2012-04-30 ENCOUNTER — Ambulatory Visit
Admission: RE | Admit: 2012-04-30 | Discharge: 2012-04-30 | Disposition: A | Discharge status: Home / Self Care | Payer: 59 | Source: Ambulatory Visit | Attending: Internal Medicine | Admitting: Internal Medicine

## 2012-04-30 DIAGNOSIS — J411 Mucopurulent chronic bronchitis: Secondary | ICD-10-CM

## 2012-05-05 ENCOUNTER — Other Ambulatory Visit: Payer: 59

## 2012-05-17 ENCOUNTER — Other Ambulatory Visit: Payer: Self-pay | Admitting: Internal Medicine

## 2012-05-17 DIAGNOSIS — J329 Chronic sinusitis, unspecified: Secondary | ICD-10-CM

## 2012-05-20 ENCOUNTER — Ambulatory Visit
Admission: RE | Admit: 2012-05-20 | Discharge: 2012-05-20 | Disposition: A | Discharge status: Home / Self Care | Payer: 59 | Source: Ambulatory Visit | Attending: Internal Medicine | Admitting: Internal Medicine

## 2012-05-20 DIAGNOSIS — J329 Chronic sinusitis, unspecified: Secondary | ICD-10-CM

## 2012-08-20 ENCOUNTER — Other Ambulatory Visit: Payer: Self-pay | Admitting: Otolaryngology

## 2012-08-20 DIAGNOSIS — J342 Deviated nasal septum: Secondary | ICD-10-CM

## 2012-08-20 DIAGNOSIS — J329 Chronic sinusitis, unspecified: Secondary | ICD-10-CM

## 2012-08-31 ENCOUNTER — Other Ambulatory Visit: Payer: 59

## 2012-09-01 ENCOUNTER — Other Ambulatory Visit: Payer: 59

## 2013-01-30 ENCOUNTER — Encounter (HOSPITAL_COMMUNITY): Payer: Self-pay | Admitting: Physical Medicine and Rehabilitation

## 2013-01-30 ENCOUNTER — Emergency Department (HOSPITAL_COMMUNITY)
Admission: EM | Admit: 2013-01-30 | Discharge: 2013-01-30 | Disposition: A | Discharge status: Home / Self Care | Payer: 59 | Attending: Emergency Medicine | Admitting: Emergency Medicine

## 2013-01-30 DIAGNOSIS — J45901 Unspecified asthma with (acute) exacerbation: Secondary | ICD-10-CM | POA: Insufficient documentation

## 2013-01-30 DIAGNOSIS — Z3202 Encounter for pregnancy test, result negative: Secondary | ICD-10-CM | POA: Insufficient documentation

## 2013-01-30 DIAGNOSIS — K219 Gastro-esophageal reflux disease without esophagitis: Secondary | ICD-10-CM | POA: Insufficient documentation

## 2013-01-30 DIAGNOSIS — IMO0002 Reserved for concepts with insufficient information to code with codable children: Secondary | ICD-10-CM | POA: Insufficient documentation

## 2013-01-30 DIAGNOSIS — Z880 Allergy status to penicillin: Secondary | ICD-10-CM | POA: Insufficient documentation

## 2013-01-30 DIAGNOSIS — Z79899 Other long term (current) drug therapy: Secondary | ICD-10-CM | POA: Insufficient documentation

## 2013-01-30 DIAGNOSIS — R109 Unspecified abdominal pain: Secondary | ICD-10-CM | POA: Insufficient documentation

## 2013-01-30 DIAGNOSIS — R197 Diarrhea, unspecified: Secondary | ICD-10-CM | POA: Insufficient documentation

## 2013-01-30 LAB — URINALYSIS, ROUTINE W REFLEX MICROSCOPIC
Bilirubin Urine: NEGATIVE
Glucose, UA: NEGATIVE mg/dL
Hgb urine dipstick: NEGATIVE
Ketones, ur: NEGATIVE mg/dL
Leukocytes, UA: NEGATIVE
Nitrite: NEGATIVE
Protein, ur: NEGATIVE mg/dL
Specific Gravity, Urine: 1.008 (ref 1.005–1.030)
Urobilinogen, UA: 0.2 mg/dL (ref 0.0–1.0)
pH: 5.5 (ref 5.0–8.0)

## 2013-01-30 LAB — POCT PREGNANCY, URINE: Preg Test, Ur: NEGATIVE

## 2013-01-30 NOTE — ED Notes (Signed)
EDP spoke with patient about plan of care. She has no further questions at the time. To be discharged home and follow up with PCP if symptoms persist.

## 2013-01-30 NOTE — ED Notes (Signed)
Pt presents to department for evaluation of lower abdominal pain and diarrhea. Pt states symptoms intermittent since Wednesday. Now states she noticed mucus in stool this morning at church. 7/10 abdominal pain at the time. Denies urinary symptoms. Pt is alert and oriented x4.

## 2013-01-30 NOTE — ED Provider Notes (Signed)
CSN: [id number]     Arrival date & time 01/30/13  1478 History     First MD Initiated Contact with Patient 01/30/13 [phone]     Chief Complaint  Patient presents with  . Abdominal Pain  . Diarrhea   (Consider location/radiation/quality/duration/timing/severity/associated sxs/prior Treatment) Patient is a 32 y.o. female presenting with diarrhea. The history is provided by the patient.  Diarrhea Quality:  Mucous Severity:  Moderate Onset quality:  Gradual Number of episodes:  7-10 Duration:  4 days Timing:  Constant Progression:  Unchanged Relieved by:  Nothing Worsened by:  Nothing tried Associated symptoms: abdominal pain ( cramping lower abdominal pain)   Associated symptoms: no fever and no vomiting   Abdominal pain:    Location:  Suprapubic   Quality:  Cramping   Severity:  Moderate   Onset quality:  Gradual   Timing:  Intermittent   Progression:  Unchanged   Chronicity:  New   Past Medical History  Diagnosis Date  . Asthma   . GERD (gastroesophageal reflux disease)   . GERD (gastroesophageal reflux disease)    No past surgical history on file. Family History  Problem Relation Age of Onset  . Hypertension     History  Substance Use Topics  . Smoking status: Never Smoker   . Smokeless tobacco: Never Used  . Alcohol Use: No   OB History   Grav Para Term Preterm Abortions TAB SAB Ect Mult Living                 Review of Systems  Constitutional: Negative for fever.  HENT: Negative for congestion.   Respiratory: Negative for cough and shortness of breath.   Cardiovascular: Negative for chest pain.  Gastrointestinal: Positive for abdominal pain ( cramping lower abdominal pain) and diarrhea. Negative for nausea and vomiting.  All other systems reviewed and are negative.    Allergies  Penicillins  Home Medications   Current Outpatient Rx  Name  Route  Sig  Dispense  Refill  . beclomethasone (QVAR) 80 MCG/ACT inhaler   Inhalation   Inhale 1 puff  into the lungs 2 (two) times daily.           . cholecalciferol (VITAMIN D) 1000 UNITS tablet   Oral   Take 1,000 Units by mouth daily.           Marland Kitchen EXPIRED: esomeprazole (NEXIUM) 40 MG capsule   Oral   Take 1 capsule (40 mg total) by mouth daily.   30 capsule   1   . Multiple Vitamin (MULTIVITAMIN) capsule   Oral   Take 1 capsule by mouth daily.           Maxwell Caul Bicarbonate (ZEGERID) 20-1100 MG CAPS      2 at bedtime    28 each      . PROAIR HFA 108 (90 BASE) MCG/ACT inhaler      2 puffs every 6 (six) hours as needed.          . Probiotic Product (PROBIOTIC FORMULA PO)   Oral   Take 1 tablet by mouth daily.           . Pseudoeph-Doxylamine-DM-APAP (NYQUIL PO)      Per bottle directions as needed           BP 102/63  Pulse 70  Temp(Src) 97.1 F (36.2 C) (Oral)  Resp 18  SpO2 98% Physical Exam  Nursing note and vitals reviewed. Constitutional: She is oriented to person, place,  and time. She appears well-developed and well-nourished. No distress.  HENT:  Head: Normocephalic and atraumatic.  Mouth/Throat: Oropharynx is clear and moist.  Eyes: Conjunctivae are normal. Pupils are equal, round, and reactive to light. No scleral icterus.  Neck: Neck supple.  Cardiovascular: Normal rate, regular rhythm, normal heart sounds and intact distal pulses.   No murmur heard. Pulmonary/Chest: Effort normal. No stridor. No respiratory distress. She has wheezes (mild, expiratory, diffuse "I always have wheezing"). She has no rales.  Abdominal: Soft. Bowel sounds are normal. She exhibits no distension. There is tenderness in the right lower quadrant and left lower quadrant. There is no rigidity, no rebound and no guarding.  Musculoskeletal: Normal range of motion.  Neurological: She is alert and oriented to person, place, and time.  Skin: Skin is warm and dry. No rash noted.  Psychiatric: She has a normal mood and affect. Her behavior is normal.    ED  Course   Procedures (including critical care time)  Labs Reviewed  URINALYSIS, ROUTINE W REFLEX MICROSCOPIC  POCT PREGNANCY, URINE   No results found. 1. Diarrhea     MDM  32 year old female with 4 days of cramping lower abdominal pain and diarrhea. No vomiting. No fevers. Well-appearing, nontoxic, not dehydrated. Abdomen is soft, minimally tender in lower abdomen, no r/r/g.  Urinalysis normal.  U preg neg.  History and exam not c/w ov torsion, TOA, appendicitis, or bowel obstruction.  No vaginal symptoms or risk for STD.  Symptoms clearly GI in origin with diarrhea as predominant symptom.  No blood in stool.  Advised supportive supportive care and PCP follow up.  Return precautions given.  Pt in agreement with plan.    Candyce Churn, MD 01/30/13 [phone]

## 2014-08-04 ENCOUNTER — Encounter: Payer: Self-pay | Admitting: Podiatrist

## 2014-08-04 ENCOUNTER — Ambulatory Visit (INDEPENDENT_AMBULATORY_CARE_PROVIDER_SITE_OTHER): Payer: 59 | Admitting: Podiatrist

## 2014-08-04 VITALS — BP 142/86 | HR 60 | Resp 12

## 2014-08-04 DIAGNOSIS — Q828 Other specified congenital malformations of skin: Secondary | ICD-10-CM | POA: Diagnosis not present

## 2014-08-04 MED ORDER — DICLOFENAC SODIUM 3 % TD GEL: 1.0000 "application " | Freq: Every day | TRANSDERMAL | Status: DC

## 2014-08-04 NOTE — Progress Notes (Signed)
   Subjective:    Patient ID: Rachel Ferrell, female    DOB: May 05, 1981, 34 y.o.   MRN: [id number]  HPI PT STATED B/L BALL OF THE FOOT, LT GREAT TOE HAVE WARTS AND BEEN THERE FOR 1 YEAR. B/L FEET ARE GETTING WORSE AND PAINFUL ESPECIALLY WHEN WALKING. TRIED PLANTAR WART REMOVAL BUT NO HELP.   Review of Systems  All other systems reviewed and are negative.      Objective:   Physical Exam  Patient is awake, alert, and oriented x 3.  In no acute distress.  Vascular status is intact with palpable pedal pulses at 2/4 DP and PT bilateral and capillary refill time within normal limits. Neurological sensation is also intact bilaterally via Semmes Weinstein monofilament at 5/5 sites. Light touch, vibratory sensation, Achilles tendon reflex is intact. Dermatological exam reveals skin color, turger and texture as normal. No open lesions present.  Musculature intact with dorsiflexion, plantarflexion, inversion, eversion.  Multiple porokeratotic lesions present in a linear fashion along the natural crease between the first and second metatarsals plantarly.  Minimally enucleated with intact integument present. Skin tension lines are present.  No capillary budding present.  Finding is bilateral.      Assessment & Plan:  Porokeratotic lesions bilateral  Recommended solaraze gel for the porokeratotic lesions since they do appear small and superficial. Gave instructions for use.  At this time they are not painful. If they become painful she will call.

## 2014-08-29 ENCOUNTER — Telehealth: Payer: Self-pay | Admitting: *Deleted

## 2014-08-29 NOTE — Telephone Encounter (Signed)
I called to inform patient that Voltaren Gel was denied by her insurance.  I also faxed the denial to her pharmacy.  Dr. Valentina Lucks is going to order a compound cream for you.  You should receive it in the mail.  They may call you to verify insurance.  I just wanted to make you aware of what was going on.  "Okay, thank you."  Prescription was sent to [facility] in [place], [place]. Compound for Diclofenac 3% and Salicylic Acid  I left a message for them to call and confirm that they received the prescription and will be able to get prescription to patient.

## 2014-08-31 NOTE — Telephone Encounter (Signed)
"  We did receive the prescription and we will call the patient."

## 2015-07-05 ENCOUNTER — Emergency Department (HOSPITAL_COMMUNITY): Payer: 59

## 2015-07-05 ENCOUNTER — Encounter (HOSPITAL_COMMUNITY): Payer: Self-pay

## 2015-07-05 DIAGNOSIS — K219 Gastro-esophageal reflux disease without esophagitis: Secondary | ICD-10-CM | POA: Diagnosis not present

## 2015-07-05 DIAGNOSIS — Z3202 Encounter for pregnancy test, result negative: Secondary | ICD-10-CM | POA: Insufficient documentation

## 2015-07-05 DIAGNOSIS — Z79899 Other long term (current) drug therapy: Secondary | ICD-10-CM | POA: Insufficient documentation

## 2015-07-05 DIAGNOSIS — J159 Unspecified bacterial pneumonia: Secondary | ICD-10-CM | POA: Insufficient documentation

## 2015-07-05 DIAGNOSIS — Z7951 Long term (current) use of inhaled steroids: Secondary | ICD-10-CM | POA: Insufficient documentation

## 2015-07-05 DIAGNOSIS — R0602 Shortness of breath: Secondary | ICD-10-CM | POA: Diagnosis present

## 2015-07-05 DIAGNOSIS — Z880 Allergy status to penicillin: Secondary | ICD-10-CM | POA: Diagnosis not present

## 2015-07-05 DIAGNOSIS — J45901 Unspecified asthma with (acute) exacerbation: Secondary | ICD-10-CM | POA: Diagnosis not present

## 2015-07-05 LAB — CBC
HCT: 39 % (ref 36.0–46.0)
Hemoglobin: 13.4 g/dL (ref 12.0–15.0)
MCH: 30.3 pg (ref 26.0–34.0)
MCHC: 34.4 g/dL (ref 30.0–36.0)
MCV: 88.2 fL (ref 78.0–100.0)
Platelets: 193 10*3/uL (ref 150–400)
RBC: 4.42 MIL/uL (ref 3.87–5.11)
RDW: 13.5 % (ref 11.5–15.5)
WBC: 13.7 10*3/uL — ABNORMAL HIGH (ref 4.0–10.5)

## 2015-07-05 LAB — I-STAT TROPONIN, ED: Troponin i, poc: 0 ng/mL (ref 0.00–0.08)

## 2015-07-05 NOTE — ED Notes (Signed)
Pt started having left sided cp tonight at 2100 and hurts to take a deep breath in. Was having chills until she took tylenol tonight. abd pain also. Took tylenol at 2245, two extra strength. No nausea, vomiting, or diarrhea.

## 2015-07-06 ENCOUNTER — Encounter (HOSPITAL_COMMUNITY): Payer: Self-pay | Admitting: *Deleted

## 2015-07-06 ENCOUNTER — Emergency Department (HOSPITAL_COMMUNITY): Payer: 59

## 2015-07-06 ENCOUNTER — Emergency Department (HOSPITAL_COMMUNITY)
Admission: EM | Admit: 2015-07-06 | Discharge: 2015-07-06 | Disposition: A | Discharge status: Home / Self Care | Payer: 59 | Attending: Emergency Medicine | Admitting: Emergency Medicine

## 2015-07-06 DIAGNOSIS — J189 Pneumonia, unspecified organism: Secondary | ICD-10-CM

## 2015-07-06 LAB — BASIC METABOLIC PANEL
Anion gap: 13 (ref 5–15)
BUN: 13 mg/dL (ref 6–20)
CO2: 24 mmol/L (ref 22–32)
Calcium: 9.6 mg/dL (ref 8.9–10.3)
Chloride: 101 mmol/L (ref 101–111)
Creatinine, Ser: 0.79 mg/dL (ref 0.44–1.00)
GFR calc Af Amer: >60 mL/min (ref >60)
GFR calc non Af Amer: >60 mL/min (ref >60)
Glucose, Bld: 116 mg/dL — ABNORMAL HIGH (ref 65–99)
Potassium: 3.5 mmol/L (ref 3.5–5.1)
Sodium: 138 mmol/L (ref 135–145)

## 2015-07-06 LAB — D-DIMER, QUANTITATIVE: D-Dimer, Quant: 0.51 ug/mL-FEU — ABNORMAL HIGH (ref 0.00–0.50)

## 2015-07-06 LAB — I-STAT BETA HCG BLOOD, ED (MC, WL, AP ONLY): I-stat hCG, quantitative: <5 m[IU]/mL (ref <5)

## 2015-07-06 MED ORDER — OXYCODONE-ACETAMINOPHEN 5-325 MG PO TABS
1.0000 | ORAL_TABLET | Freq: Once | ORAL | Status: AC
  Administered 2015-07-06: 1 via ORAL
  Filled 2015-07-06: qty 1

## 2015-07-06 MED ORDER — IBUPROFEN 200 MG PO TABS
600.0000 mg | ORAL_TABLET | Freq: Once | ORAL | Status: AC
  Administered 2015-07-06: 600 mg via ORAL
  Filled 2015-07-06: qty 3

## 2015-07-06 MED ORDER — AZITHROMYCIN 250 MG PO TABS: ORAL_TABLET | ORAL | Status: DC

## 2015-07-06 MED ORDER — AZITHROMYCIN 250 MG PO TABS
500.0000 mg | ORAL_TABLET | Freq: Once | ORAL | Status: AC
  Administered 2015-07-06: 500 mg via ORAL
  Filled 2015-07-06: qty 2

## 2015-07-06 MED ORDER — IOHEXOL 350 MG/ML SOLN
100.0000 mL | Freq: Once | INTRAVENOUS | Status: AC | PRN
  Administered 2015-07-06: 100 mL via INTRAVENOUS

## 2015-07-06 NOTE — ED Notes (Signed)
Patient verbalized understanding of discharge instructions and denies any further needs or questions at this time. VS stable. Patient ambulatory with steady gait.  

## 2015-07-06 NOTE — Discharge Instructions (Signed)

## 2015-07-06 NOTE — ED Provider Notes (Signed)
CSN: [id number]     Arrival date & time 07/05/15  2321 History   By signing my name below, I, Forrestine Him, attest that this documentation has been prepared under the direction and in the presence of Ripley Fraise, MD.  Electronically Signed: Forrestine Him, ED Scribe. 07/06/2015. 1:46 AM.   Chief Complaint  Patient presents with  . Chest Pain  . Shortness of Breath   Patient is a 35 y.o. female presenting with chest pain and shortness of breath. The history is provided by the patient. No language interpreter was used.  Chest Pain Pain location:  L chest Pain quality: sharp   Pain radiates to:  L shoulder Pain radiates to the back: no   Pain severity:  Moderate Onset quality:  Sudden Duration:  5 hours Timing:  Constant Progression:  Unchanged Chronicity:  New Relieved by:  None tried Worsened by:  Deep breathing and movement Associated symptoms: cough and shortness of breath   Associated symptoms: no abdominal pain, no back pain, no dizziness, no fever, no headache, no nausea, no numbness and not vomiting   Shortness of Breath Associated symptoms: chest pain and cough   Associated symptoms: no abdominal pain, no fever, no headaches and no vomiting     HPI Comments: Rachel Ferrell is a 35 y.o. female with a PMHx of GERD who presents to the Emergency Department complaining of sudden onset, constant, ongoing L sided chest pain that radiates into the L shoulder onset 9:00 PM this evening. Pain is described as sharp. Discomfort is exacerbated with deep breathing and with certain movements. No alleviating factors at this time. Pt also reports an episode of chills this evening, ongoing generalized weakness and shortness of breath. Ongoing paraesthesias to the fingers that reoccurs at night time only x last few days also mentioned. No OTC medications or home remedies attempted prior to arrival. She denies any fever, nausea, vomiting, abdominal pain, or dizziness. No recent long distance  travel. No prior history of blood clots. She denies any family history of early cardiac disease.  PCP: Pcp Not In System    Past Medical History  Diagnosis Date  . Asthma   . GERD (gastroesophageal reflux disease)   . GERD (gastroesophageal reflux disease)    History reviewed. No pertinent past surgical history. Family History  Problem Relation Age of Onset  . Hypertension     Social History  Substance Use Topics  . Smoking status: Never Smoker   . Smokeless tobacco: Never Used  . Alcohol Use: No   OB History    No data available     Review of Systems  Constitutional: Negative for fever and chills.  Respiratory: Positive for cough and shortness of breath.   Cardiovascular: Positive for chest pain.  Gastrointestinal: Negative for nausea, vomiting and abdominal pain.  Musculoskeletal: Negative for back pain.  Neurological: Negative for dizziness, numbness and headaches.  Psychiatric/Behavioral: Negative for confusion.  All other systems reviewed and are negative.     Allergies  Penicillins  Home Medications   Prior to Admission medications   Medication Sig Start Date End Date Taking? Authorizing Provider  albuterol (PROVENTIL HFA;VENTOLIN HFA) 108 (90 BASE) MCG/ACT inhaler Inhale 2 puffs into the lungs every 6 (six) hours as needed for wheezing.    Historical Provider, MD  cholecalciferol (VITAMIN D) 1000 UNITS tablet Take 1,000 Units by mouth daily.      Historical Provider, MD  Diclofenac Sodium 3 % GEL Place 1 application onto the skin  daily. 08/04/14   Bronson Ing, DPM  esomeprazole (NEXIUM) 40 MG capsule Take 1 capsule (40 mg total) by mouth daily. 05/23/11 05/22/12  Tanda Rockers, MD  fluconazole (DIFLUCAN) 150 MG tablet Take 150 mg by mouth daily as needed (preventative for UTI).    Historical Provider, MD  fluticasone (FLONASE) 50 MCG/ACT nasal spray Place 2 sprays into the nose at bedtime.    Historical Provider, MD  KRILL OIL PO Take 1 tablet by mouth  daily.    Historical Provider, MD  LOPERAMIDE HCL PO Take 2 tablets by mouth daily as needed (loose stoole).    Historical Provider, MD  mometasone-formoterol (DULERA) 200-5 MCG/ACT AERO Inhale 2 puffs into the lungs daily as needed for wheezing.    Historical Provider, MD  Multiple Vitamin (MULTIVITAMIN) capsule Take 1 capsule by mouth daily.      Historical Provider, MD  Probiotic Product (PROBIOTIC FORMULA PO) Take 1 tablet by mouth daily.      Historical Provider, MD   Triage Vitals: BP 109/73 mmHg  Pulse 118  Temp(Src) 98.8 F (37.1 C)  Resp 16  SpO2 96%  LMP 07/01/2015   Physical Exam  CONSTITUTIONAL: Well developed/well nourished HEAD: Normocephalic/atraumatic EYES: EOMI ENMT: Mucous membranes moist NECK: supple no meningeal signs SPINE/BACK:entire spine nontender CV: S1/S2 noted, no murmurs/rubs/gallops noted LUNGS: Lungs are clear to auscultation bilaterally, no apparent distress ABDOMEN: soft, nontender, no rebound or guarding, bowel sounds noted throughout abdomen NEURO: Pt is awake/alert/appropriate, moves all extremitiesx4.  No facial droop.   EXTREMITIES: pulses normal/equal, full ROM, no LE edema or calf tenderness SKIN: warm, color normal PSYCH: no abnormalities of mood noted, alert and oriented to situation   ED Course  Procedures  DIAGNOSTIC STUDIES: Oxygen Saturation is 96% on RA, adequate by my interpretation.    COORDINATION OF CARE: 2:06 AM- Will give Percocet and Advil. Will order CXR, BMP, CBC, EKG, and i-stat troponin. Discussed treatment plan with pt at bedside and pt agreed to plan.      I reviewed the CXR with radiology to determine if a pneumonia was present It was felt that there was no infiltrate on CXR I ordered a d-dimer to r/o PE (pt with pleuritic CP/tachycardia) D-dimer elevated, CT chest ordered No PE was found but she does have pneumonia on CT imaging She is well appearing/nontoxic No hypoxia.  She is not in distress She is  appropriate for outpatient management We discussed strict return precautions  Labs Review Labs Reviewed  BASIC METABOLIC PANEL - Abnormal; Notable for the following:    Glucose, Bld 116 (*)    All other components within normal limits  CBC - Abnormal; Notable for the following:    WBC 13.7 (*)    All other components within normal limits  D-DIMER, QUANTITATIVE (NOT AT [facility]) - Abnormal; Notable for the following:    D-Dimer, Quant 0.51 (*)    All other components within normal limits  I-STAT TROPOININ, ED  I-STAT BETA HCG BLOOD, ED ([facility], [facility], [facility] ONLY)  I-STAT BETA HCG BLOOD, ED ([facility], [facility], [facility] ONLY)    Imaging Review Dg Chest 2 View  07/06/2015  CLINICAL DATA:  Left-sided chest pain and shortness of breath today. Nonsmoker. History of asthma. EXAM: CHEST  2 VIEW COMPARISON:  05/04/2011 FINDINGS: Mild hyperinflation. Mild increased density over the left lung base probably due to soft tissue attenuation. No focal airspace disease or consolidation in the lungs. No blunting of costophrenic angles. No pneumothorax. Mediastinal contours appear  intact. Normal heart size and pulmonary vascularity. IMPRESSION: Hyperinflation.  No evidence of active pulmonary disease. Electronically Signed   By: Lucienne Capers M.D.   On: 07/06/2015 00:11   I have personally reviewed and evaluated these images and lab results as part of my medical decision-making.   EKG Interpretation   Date/Time:  Thursday July 05 2015 23:25:10 EST Ventricular Rate:  120 PR Interval:  152 QRS Duration: 84 QT Interval:  430 QTC Calculation: 607 R Axis:   72 Text Interpretation:  Sinus tachycardia Nonspecific ST abnormality  Prolonged QT Abnormal ECG No previous ECGs available Confirmed by Christy Gentles   MD, Maizey Menendez ([id number]) on 07/06/2015 1:36:23 AM     Medications  oxyCODONE-acetaminophen (PERCOCET/ROXICET) 5-325 MG per tablet 1 tablet (1 tablet Oral Given 07/06/15 0218)  ibuprofen (ADVIL,MOTRIN) tablet 600 mg (600 mg Oral Given  07/06/15 0217)  iohexol (OMNIPAQUE) 350 MG/ML injection 100 mL (100 mLs Intravenous Contrast Given 07/06/15 0438)  azithromycin (ZITHROMAX) tablet 500 mg (500 mg Oral Given 07/06/15 0538)    MDM   Final diagnoses:  CAP (community acquired pneumonia)    Nursing notes including past medical history and social history reviewed and considered in documentation xrays/imaging reviewed by myself and considered during evaluation Labs/vital reviewed myself and considered during evaluation   I personally performed the services described in this documentation, which was scribed in my presence. The recorded information has been reviewed and is accurate.       Ripley Fraise, MD 07/06/15 [phone]

## 2015-12-28 ENCOUNTER — Emergency Department (HOSPITAL_COMMUNITY)
Admission: EM | Admit: 2015-12-28 | Discharge: 2015-12-28 | Disposition: A | Discharge status: Home / Self Care | Payer: 59 | Attending: Emergency Medicine | Admitting: Emergency Medicine

## 2015-12-28 ENCOUNTER — Encounter (HOSPITAL_COMMUNITY): Payer: Self-pay | Admitting: *Deleted

## 2015-12-28 ENCOUNTER — Emergency Department (HOSPITAL_COMMUNITY): Payer: 59

## 2015-12-28 DIAGNOSIS — J45909 Unspecified asthma, uncomplicated: Secondary | ICD-10-CM | POA: Diagnosis not present

## 2015-12-28 DIAGNOSIS — R0789 Other chest pain: Secondary | ICD-10-CM | POA: Insufficient documentation

## 2015-12-28 DIAGNOSIS — J069 Acute upper respiratory infection, unspecified: Secondary | ICD-10-CM | POA: Insufficient documentation

## 2015-12-28 LAB — CBC
HCT: 44.4 % (ref 36.0–46.0)
Hemoglobin: 14.3 g/dL (ref 12.0–15.0)
MCH: 29.4 pg (ref 26.0–34.0)
MCHC: 32.2 g/dL (ref 30.0–36.0)
MCV: 91.2 fL (ref 78.0–100.0)
Platelets: 262 10*3/uL (ref 150–400)
RBC: 4.87 MIL/uL (ref 3.87–5.11)
RDW: 13.7 % (ref 11.5–15.5)
WBC: 7.5 10*3/uL (ref 4.0–10.5)

## 2015-12-28 LAB — I-STAT BETA HCG BLOOD, ED (MC, WL, AP ONLY): I-stat hCG, quantitative: <5 m[IU]/mL (ref <5)

## 2015-12-28 LAB — BASIC METABOLIC PANEL
Anion gap: 7 (ref 5–15)
BUN: 10 mg/dL (ref 6–20)
CO2: 26 mmol/L (ref 22–32)
Calcium: 9.7 mg/dL (ref 8.9–10.3)
Chloride: 105 mmol/L (ref 101–111)
Creatinine, Ser: 0.78 mg/dL (ref 0.44–1.00)
GFR calc Af Amer: >60 mL/min (ref >60)
GFR calc non Af Amer: >60 mL/min (ref >60)
Glucose, Bld: 91 mg/dL (ref 65–99)
Potassium: 4.5 mmol/L (ref 3.5–5.1)
Sodium: 138 mmol/L (ref 135–145)

## 2015-12-28 LAB — I-STAT TROPONIN, ED: Troponin i, poc: 0 ng/mL (ref 0.00–0.08)

## 2015-12-28 MED ORDER — BENZONATATE 100 MG PO CAPS: 200.0000 mg | ORAL_CAPSULE | Freq: Two times a day (BID) | ORAL | Status: DC | PRN

## 2015-12-28 MED ORDER — PSEUDOEPHEDRINE HCL 60 MG PO TABS: 60.0000 mg | ORAL_TABLET | ORAL | Status: DC | PRN

## 2015-12-28 MED ORDER — NAPROXEN 500 MG PO TABS: 500.0000 mg | ORAL_TABLET | Freq: Two times a day (BID) | ORAL | Status: DC

## 2015-12-28 NOTE — Discharge Instructions (Signed)
Taking your medications as prescribed. Follow-up with your family doctor in the next week for follow-up and further management of your intermittent leg tingling and possible need for referral to neurology. Please return to the Emergency Department if symptoms worsen or new onset of fever, headache, lightheadedness, dizziness, visual changes, chest pain, difficulty breathing, coughing up blood, wheezing, abdominal pain, vomiting, unable to keep fluids down, leg swelling, numbness, weakness.

## 2015-12-28 NOTE — ED Provider Notes (Signed)
CSN: [id number]     Arrival date & time 12/28/15  1028 History   First MD Initiated Contact with Patient 12/28/15 1042     Chief Complaint  Patient presents with  . Chest Pain     (Consider location/radiation/quality/duration/timing/severity/associated sxs/prior Treatment) HPI   Patient is a 35 year old female past medical history of asthma who presents to the ED with complaint of chest pain, onset 4:30 this morning. Patient reports waking up with constant midsternal aching chest pain which she notes is worse with movement or coughing. She also reports she has had a reductive cough for the past 2 weeks. Denies fever, chills, headache, lightheadedness, dizziness, visual changes, nasal congestion, rhinorrhea, hemoptysis, SOB, wheezing, palpitations, N/V, abdominal pain. Patient also reports having intermittent tingling to her lower legs for the past few weeks. She notes the tingling for few minutes and then resolved spontaneously. Denies any known injury or trauma. She notes she was initially seen by her PCP this morning for her chest pain and was advised to come to the ED for further evaluation. Patient states she has had similar symptoms in the past when she was diagnosed with pneumonia. Denies smoking. Denies any personal or family cardiac history. Denies leg swelling, exogenous estrogen use, recent hospitalizations/bed rest, recent car travel, hx of DVT.  Past Medical History  Diagnosis Date  . Asthma   . GERD (gastroesophageal reflux disease)   . GERD (gastroesophageal reflux disease)    History reviewed. No pertinent past surgical history. Family History  Problem Relation Age of Onset  . Hypertension     Social History  Substance Use Topics  . Smoking status: Never Smoker   . Smokeless tobacco: Never Used  . Alcohol Use: No   OB History    No data available     Review of Systems  Respiratory: Positive for cough.   Cardiovascular: Positive for chest pain.  All other systems  reviewed and are negative.     Allergies  Review of patient's allergies indicates no known allergies.  Home Medications   Prior to Admission medications   Medication Sig Start Date End Date Taking? Authorizing Provider  albuterol (PROVENTIL HFA;VENTOLIN HFA) 108 (90 BASE) MCG/ACT inhaler Inhale 2 puffs into the lungs every 6 (six) hours as needed for wheezing.    Historical Provider, MD  azithromycin (ZITHROMAX) 250 MG tablet One tablet PO daily for 4 days - start on Saturday 07/06/15   Ripley Fraise, MD  benzonatate (TESSALON) 100 MG capsule Take 2 capsules (200 mg total) by mouth 2 (two) times daily as needed for cough. 12/28/15   [name] Dell, PA-C  cholecalciferol (VITAMIN D) 1000 UNITS tablet Take 5,000 Units by mouth daily.     Historical Provider, MD  fluconazole (DIFLUCAN) 150 MG tablet Take 150 mg by mouth daily as needed (preventative for UTI).    Historical Provider, MD  fluticasone (FLONASE) 50 MCG/ACT nasal spray Place 2 sprays into the nose at bedtime.    Historical Provider, MD  LOPERAMIDE HCL PO Take 2 tablets by mouth daily as needed (loose stoole).    Historical Provider, MD  mometasone-formoterol (DULERA) 200-5 MCG/ACT AERO Inhale 1 puff into the lungs every morning.     Historical Provider, MD  montelukast (SINGULAIR) 10 MG tablet Take 10 mg by mouth at bedtime.    Historical Provider, MD  Multiple Vitamin (MULTIVITAMIN) capsule Take 1 capsule by mouth daily.      Historical Provider, MD  naproxen (NAPROSYN) 500 MG tablet Take 1  tablet (500 mg total) by mouth 2 (two) times daily. 12/28/15   [name] Dell, PA-C  Probiotic Product (PROBIOTIC FORMULA PO) Take 1 tablet by mouth daily.      Historical Provider, MD  pseudoephedrine (SUDAFED) 60 MG tablet Take 1 tablet (60 mg total) by mouth every 4 (four) hours as needed for congestion. 12/28/15   Chesley Noon Gaynell Eggleton, PA-C   BP 99/65 mmHg  Pulse 65  Temp(Src) 98.1 F (36.7 C) (Oral)  Resp 20  SpO2  100%  LMP 12/23/2015 Physical Exam  Constitutional: She is oriented to person, place, and time. She appears well-developed and well-nourished. No distress.  HENT:  Head: Normocephalic and atraumatic.  Mouth/Throat: Oropharynx is clear and moist. No oropharyngeal exudate.  Eyes: Conjunctivae and EOM are normal. Right eye exhibits no discharge. Left eye exhibits no discharge. No scleral icterus.  Neck: Normal range of motion. Neck supple.  Cardiovascular: Normal rate, regular rhythm, normal heart sounds and intact distal pulses.   Pulmonary/Chest: Effort normal and breath sounds normal. No respiratory distress. She has no wheezes. She has no rales. She exhibits tenderness (midsternal chest wall TTP). She exhibits no laceration, no crepitus, no edema, no deformity, no swelling and no retraction.  Abdominal: Soft. Bowel sounds are normal. She exhibits no distension and no mass. There is no tenderness. There is no rebound and no guarding.  Musculoskeletal: Normal range of motion. She exhibits no edema.  No LE swelling. 2+ PT pulses. Sensation grossly intact. FROM of BLE.  Neurological: She is alert and oriented to person, place, and time.  Skin: Skin is warm and dry. She is not diaphoretic.  Nursing note and vitals reviewed.   ED Course  Procedures (including critical care time) Labs Review Labs Reviewed  BASIC METABOLIC PANEL  CBC  I-STAT [place], ED  I-STAT BETA HCG BLOOD, ED ([facility], [facility], [facility] ONLY)    Imaging Review Dg Chest 2 View  12/28/2015  CLINICAL DATA:  Acute chest pain, cough, history asthma EXAM: CHEST  2 VIEW COMPARISON:  07/05/2015, 07/06/2015 FINDINGS: The heart size and mediastinal contours are within normal limits. Both lungs are clear. The visualized skeletal structures are unremarkable. IMPRESSION: No active cardiopulmonary disease. Electronically Signed   By: Jerilynn Mages.  Shick M.D.   On: 12/28/2015 11:43   I have personally reviewed and evaluated these images and lab results as  part of my medical decision-making.   EKG Interpretation   Date/Time:  Friday December 28 2015 10:35:33 EDT Ventricular Rate:  59 PR Interval:  164 QRS Duration: 82 QT Interval:  416 QTC Calculation: 411 R Axis:   69 Text Interpretation:  Sinus bradycardia with sinus arrhythmia Cannot rule  out Anterior infarct , age undetermined Abnormal ECG Confirmed by  YELVERTON  MD, DAVID ([id number]) on 12/28/2015 11:58:01 AM      MDM   Final diagnoses:  URI (upper respiratory infection)  Chest wall pain    Patient presents with chest pain with associated productive cough. Denies shortness of breath. She also reports having intermittent tingling to bilateral legs over the past 2 weeks with spontaneous resolution after a few minutes, denies any recent injury or swelling. VSS. Exam revealed tenderness to midsternal chest wall. Lungs clear to auscultation bilaterally. Bilateral lower extremities neurovascular intact, sensation grossly intact. EKG showed sinus bradycardia with sinus arrhythmia, HR 59. Troponin negative. Chest x-ray negative. Remaining labs unremarkable. On reevaluation, patient is resting comfortably in bed denies any chest pain or discomfort. HEART score <3. I have a low  suspicion for ACS, PE, dissection, or other acute cardiac event at this time and suspect pt's sxs are likely due to musculoskeletal chest pain due to viral URI. Patient's lower extremities neurovascular intact on exam. Advised patient to follow up with her PCP outpatient and possible need for outpatient neurology referral for further evaluation of intermittent tingling. Discussed results of laboratory discharge patient. Plan discharge patient home with antitussive, NSAIDs and decongestion. Advised patient to follow up with PCP. Discussed return precautions with patient.      Chesley Noon [place], [place] 12/28/15 1215   Julianne Rice, MD 01/13/16 [phone]

## 2015-12-28 NOTE — ED Notes (Signed)
Pt returned from xray, placed back on monitor.

## 2015-12-28 NOTE — ED Notes (Addendum)
Pt reports mid chest pain since 0430. Denies sob but had recent cough. Reports intermittent tingling to bilateral legs over past two weeks. Airway intact and ekg done.

## 2018-01-19 ENCOUNTER — Ambulatory Visit (INDEPENDENT_AMBULATORY_CARE_PROVIDER_SITE_OTHER): Payer: 59 | Admitting: Clinical

## 2018-01-19 DIAGNOSIS — F419 Anxiety disorder, unspecified: Secondary | ICD-10-CM | POA: Diagnosis not present

## 2018-02-04 ENCOUNTER — Ambulatory Visit (INDEPENDENT_AMBULATORY_CARE_PROVIDER_SITE_OTHER): Payer: 59 | Admitting: Clinical

## 2018-02-04 DIAGNOSIS — F419 Anxiety disorder, unspecified: Secondary | ICD-10-CM | POA: Diagnosis not present

## 2018-02-16 ENCOUNTER — Ambulatory Visit: Payer: 59 | Admitting: Clinical

## 2018-03-25 ENCOUNTER — Ambulatory Visit (INDEPENDENT_AMBULATORY_CARE_PROVIDER_SITE_OTHER): Payer: 59 | Admitting: Clinical

## 2018-03-25 DIAGNOSIS — F419 Anxiety disorder, unspecified: Secondary | ICD-10-CM

## 2018-04-26 ENCOUNTER — Ambulatory Visit (INDEPENDENT_AMBULATORY_CARE_PROVIDER_SITE_OTHER): Payer: 59 | Admitting: Clinical

## 2018-04-26 DIAGNOSIS — F419 Anxiety disorder, unspecified: Secondary | ICD-10-CM | POA: Diagnosis not present

## 2018-05-04 ENCOUNTER — Ambulatory Visit (INDEPENDENT_AMBULATORY_CARE_PROVIDER_SITE_OTHER): Payer: 59 | Admitting: Clinical

## 2018-05-04 DIAGNOSIS — F419 Anxiety disorder, unspecified: Secondary | ICD-10-CM | POA: Diagnosis not present

## 2018-05-18 ENCOUNTER — Ambulatory Visit: Payer: 59 | Admitting: Clinical

## 2018-05-24 ENCOUNTER — Ambulatory Visit (INDEPENDENT_AMBULATORY_CARE_PROVIDER_SITE_OTHER): Payer: 59 | Admitting: Clinical

## 2018-05-24 DIAGNOSIS — F419 Anxiety disorder, unspecified: Secondary | ICD-10-CM | POA: Diagnosis not present

## 2018-06-22 ENCOUNTER — Ambulatory Visit (INDEPENDENT_AMBULATORY_CARE_PROVIDER_SITE_OTHER): Payer: 59 | Admitting: Clinical

## 2018-06-22 DIAGNOSIS — F419 Anxiety disorder, unspecified: Secondary | ICD-10-CM

## 2018-07-12 ENCOUNTER — Ambulatory Visit: Payer: 59 | Admitting: Clinical

## 2018-08-10 ENCOUNTER — Ambulatory Visit (INDEPENDENT_AMBULATORY_CARE_PROVIDER_SITE_OTHER): Payer: Federal, State, Local not specified - PPO | Admitting: Clinical

## 2018-08-10 DIAGNOSIS — F419 Anxiety disorder, unspecified: Secondary | ICD-10-CM | POA: Diagnosis not present

## 2018-09-07 ENCOUNTER — Ambulatory Visit (INDEPENDENT_AMBULATORY_CARE_PROVIDER_SITE_OTHER): Payer: Federal, State, Local not specified - PPO | Admitting: Clinical

## 2018-09-07 DIAGNOSIS — F419 Anxiety disorder, unspecified: Secondary | ICD-10-CM

## 2018-09-10 DIAGNOSIS — H1033 Unspecified acute conjunctivitis, bilateral: Secondary | ICD-10-CM | POA: Diagnosis not present

## 2018-09-20 ENCOUNTER — Ambulatory Visit (INDEPENDENT_AMBULATORY_CARE_PROVIDER_SITE_OTHER): Payer: Federal, State, Local not specified - PPO | Admitting: Clinical

## 2018-09-20 DIAGNOSIS — F419 Anxiety disorder, unspecified: Secondary | ICD-10-CM

## 2018-10-25 DIAGNOSIS — K219 Gastro-esophageal reflux disease without esophagitis: Secondary | ICD-10-CM | POA: Diagnosis not present

## 2018-10-25 DIAGNOSIS — J4540 Moderate persistent asthma, uncomplicated: Secondary | ICD-10-CM | POA: Diagnosis not present

## 2018-10-25 DIAGNOSIS — J3089 Other allergic rhinitis: Secondary | ICD-10-CM | POA: Diagnosis not present

## 2018-10-25 DIAGNOSIS — B999 Unspecified infectious disease: Secondary | ICD-10-CM | POA: Diagnosis not present

## 2018-12-17 DIAGNOSIS — Z20828 Contact with and (suspected) exposure to other viral communicable diseases: Secondary | ICD-10-CM | POA: Diagnosis not present

## 2019-04-20 DIAGNOSIS — Z20828 Contact with and (suspected) exposure to other viral communicable diseases: Secondary | ICD-10-CM | POA: Diagnosis not present

## 2019-05-13 DIAGNOSIS — J45909 Unspecified asthma, uncomplicated: Secondary | ICD-10-CM | POA: Diagnosis not present

## 2019-05-13 DIAGNOSIS — Z0000 Encounter for general adult medical examination without abnormal findings: Secondary | ICD-10-CM | POA: Diagnosis not present

## 2019-05-13 DIAGNOSIS — E559 Vitamin D deficiency, unspecified: Secondary | ICD-10-CM | POA: Diagnosis not present

## 2019-05-20 DIAGNOSIS — Z0000 Encounter for general adult medical examination without abnormal findings: Secondary | ICD-10-CM | POA: Diagnosis not present

## 2019-07-21 DIAGNOSIS — D1801 Hemangioma of skin and subcutaneous tissue: Secondary | ICD-10-CM | POA: Diagnosis not present

## 2019-07-21 DIAGNOSIS — L578 Other skin changes due to chronic exposure to nonionizing radiation: Secondary | ICD-10-CM | POA: Diagnosis not present

## 2019-07-21 DIAGNOSIS — L700 Acne vulgaris: Secondary | ICD-10-CM | POA: Diagnosis not present

## 2019-07-21 DIAGNOSIS — L814 Other melanin hyperpigmentation: Secondary | ICD-10-CM | POA: Diagnosis not present

## 2019-08-08 DIAGNOSIS — K219 Gastro-esophageal reflux disease without esophagitis: Secondary | ICD-10-CM | POA: Diagnosis not present

## 2019-08-08 DIAGNOSIS — J4540 Moderate persistent asthma, uncomplicated: Secondary | ICD-10-CM | POA: Diagnosis not present

## 2019-08-08 DIAGNOSIS — B999 Unspecified infectious disease: Secondary | ICD-10-CM | POA: Diagnosis not present

## 2019-08-08 DIAGNOSIS — J3089 Other allergic rhinitis: Secondary | ICD-10-CM | POA: Diagnosis not present

## 2019-11-01 ENCOUNTER — Ambulatory Visit (INDEPENDENT_AMBULATORY_CARE_PROVIDER_SITE_OTHER): Payer: 59 | Admitting: Clinical

## 2019-11-01 DIAGNOSIS — F419 Anxiety disorder, unspecified: Secondary | ICD-10-CM

## 2019-11-17 ENCOUNTER — Ambulatory Visit (INDEPENDENT_AMBULATORY_CARE_PROVIDER_SITE_OTHER): Payer: 59 | Admitting: Clinical

## 2019-11-17 DIAGNOSIS — F419 Anxiety disorder, unspecified: Secondary | ICD-10-CM

## 2019-12-19 ENCOUNTER — Ambulatory Visit (INDEPENDENT_AMBULATORY_CARE_PROVIDER_SITE_OTHER): Payer: 59 | Admitting: Clinical

## 2019-12-19 DIAGNOSIS — F419 Anxiety disorder, unspecified: Secondary | ICD-10-CM | POA: Diagnosis not present

## 2019-12-20 ENCOUNTER — Ambulatory Visit: Payer: 59 | Admitting: Clinical

## 2020-01-05 DIAGNOSIS — J3089 Other allergic rhinitis: Secondary | ICD-10-CM | POA: Diagnosis not present

## 2020-01-05 DIAGNOSIS — J4540 Moderate persistent asthma, uncomplicated: Secondary | ICD-10-CM | POA: Diagnosis not present

## 2020-01-05 DIAGNOSIS — K219 Gastro-esophageal reflux disease without esophagitis: Secondary | ICD-10-CM | POA: Diagnosis not present

## 2020-01-05 DIAGNOSIS — B999 Unspecified infectious disease: Secondary | ICD-10-CM | POA: Diagnosis not present

## 2020-01-11 ENCOUNTER — Ambulatory Visit (INDEPENDENT_AMBULATORY_CARE_PROVIDER_SITE_OTHER): Payer: 59 | Admitting: Clinical

## 2020-01-11 DIAGNOSIS — F419 Anxiety disorder, unspecified: Secondary | ICD-10-CM | POA: Diagnosis not present

## 2020-01-16 ENCOUNTER — Ambulatory Visit: Payer: 59 | Admitting: Clinical

## 2020-02-21 ENCOUNTER — Ambulatory Visit (INDEPENDENT_AMBULATORY_CARE_PROVIDER_SITE_OTHER): Payer: 59 | Admitting: Clinical

## 2020-02-21 DIAGNOSIS — F419 Anxiety disorder, unspecified: Secondary | ICD-10-CM

## 2020-03-09 DIAGNOSIS — K219 Gastro-esophageal reflux disease without esophagitis: Secondary | ICD-10-CM | POA: Diagnosis not present

## 2020-03-09 DIAGNOSIS — J4540 Moderate persistent asthma, uncomplicated: Secondary | ICD-10-CM | POA: Diagnosis not present

## 2020-03-09 DIAGNOSIS — B999 Unspecified infectious disease: Secondary | ICD-10-CM | POA: Diagnosis not present

## 2020-03-09 DIAGNOSIS — J3089 Other allergic rhinitis: Secondary | ICD-10-CM | POA: Diagnosis not present

## 2020-03-13 DIAGNOSIS — J3089 Other allergic rhinitis: Secondary | ICD-10-CM | POA: Diagnosis not present

## 2020-03-13 DIAGNOSIS — K219 Gastro-esophageal reflux disease without esophagitis: Secondary | ICD-10-CM | POA: Diagnosis not present

## 2020-03-13 DIAGNOSIS — J4540 Moderate persistent asthma, uncomplicated: Secondary | ICD-10-CM | POA: Diagnosis not present

## 2020-03-13 DIAGNOSIS — B999 Unspecified infectious disease: Secondary | ICD-10-CM | POA: Diagnosis not present

## 2020-03-28 DIAGNOSIS — M9901 Segmental and somatic dysfunction of cervical region: Secondary | ICD-10-CM | POA: Diagnosis not present

## 2020-04-02 DIAGNOSIS — M9901 Segmental and somatic dysfunction of cervical region: Secondary | ICD-10-CM | POA: Diagnosis not present

## 2020-04-05 DIAGNOSIS — M9901 Segmental and somatic dysfunction of cervical region: Secondary | ICD-10-CM | POA: Diagnosis not present

## 2020-04-17 ENCOUNTER — Ambulatory Visit (INDEPENDENT_AMBULATORY_CARE_PROVIDER_SITE_OTHER): Payer: 59 | Admitting: Clinical

## 2020-04-17 DIAGNOSIS — F419 Anxiety disorder, unspecified: Secondary | ICD-10-CM

## 2020-05-08 DIAGNOSIS — M9901 Segmental and somatic dysfunction of cervical region: Secondary | ICD-10-CM | POA: Diagnosis not present

## 2020-05-16 ENCOUNTER — Ambulatory Visit (INDEPENDENT_AMBULATORY_CARE_PROVIDER_SITE_OTHER): Payer: 59 | Admitting: Clinical

## 2020-05-16 DIAGNOSIS — F419 Anxiety disorder, unspecified: Secondary | ICD-10-CM | POA: Diagnosis not present

## 2020-05-28 DIAGNOSIS — M9901 Segmental and somatic dysfunction of cervical region: Secondary | ICD-10-CM | POA: Diagnosis not present

## 2020-06-14 ENCOUNTER — Ambulatory Visit: Payer: 59 | Admitting: Clinical

## 2020-06-15 ENCOUNTER — Ambulatory Visit (INDEPENDENT_AMBULATORY_CARE_PROVIDER_SITE_OTHER): Payer: 59 | Admitting: Clinical

## 2020-06-15 DIAGNOSIS — F419 Anxiety disorder, unspecified: Secondary | ICD-10-CM

## 2020-06-18 DIAGNOSIS — M9901 Segmental and somatic dysfunction of cervical region: Secondary | ICD-10-CM | POA: Diagnosis not present

## 2020-06-26 DIAGNOSIS — M9901 Segmental and somatic dysfunction of cervical region: Secondary | ICD-10-CM | POA: Diagnosis not present

## 2020-07-02 DIAGNOSIS — M9901 Segmental and somatic dysfunction of cervical region: Secondary | ICD-10-CM | POA: Diagnosis not present

## 2020-07-03 DIAGNOSIS — Z113 Encounter for screening for infections with a predominantly sexual mode of transmission: Secondary | ICD-10-CM | POA: Diagnosis not present

## 2020-07-03 DIAGNOSIS — Z6823 Body mass index (BMI) 23.0-23.9, adult: Secondary | ICD-10-CM | POA: Diagnosis not present

## 2020-07-03 DIAGNOSIS — Z124 Encounter for screening for malignant neoplasm of cervix: Secondary | ICD-10-CM | POA: Diagnosis not present

## 2020-07-03 DIAGNOSIS — Z01419 Encounter for gynecological examination (general) (routine) without abnormal findings: Secondary | ICD-10-CM | POA: Diagnosis not present

## 2020-07-05 ENCOUNTER — Ambulatory Visit (INDEPENDENT_AMBULATORY_CARE_PROVIDER_SITE_OTHER): Payer: 59 | Admitting: Clinical

## 2020-07-05 DIAGNOSIS — F419 Anxiety disorder, unspecified: Secondary | ICD-10-CM | POA: Diagnosis not present

## 2020-07-31 ENCOUNTER — Ambulatory Visit (INDEPENDENT_AMBULATORY_CARE_PROVIDER_SITE_OTHER): Payer: 59 | Admitting: Clinical

## 2020-07-31 DIAGNOSIS — F419 Anxiety disorder, unspecified: Secondary | ICD-10-CM | POA: Diagnosis not present

## 2020-08-01 ENCOUNTER — Ambulatory Visit: Payer: 59 | Admitting: Clinical

## 2020-08-28 ENCOUNTER — Ambulatory Visit (INDEPENDENT_AMBULATORY_CARE_PROVIDER_SITE_OTHER): Payer: 59 | Admitting: Clinical

## 2020-08-28 DIAGNOSIS — F419 Anxiety disorder, unspecified: Secondary | ICD-10-CM

## 2020-09-11 ENCOUNTER — Ambulatory Visit (INDEPENDENT_AMBULATORY_CARE_PROVIDER_SITE_OTHER): Payer: 59 | Admitting: Clinical

## 2020-09-11 DIAGNOSIS — F419 Anxiety disorder, unspecified: Secondary | ICD-10-CM | POA: Diagnosis not present

## 2020-10-05 DIAGNOSIS — K219 Gastro-esophageal reflux disease without esophagitis: Secondary | ICD-10-CM | POA: Diagnosis not present

## 2020-10-05 DIAGNOSIS — B999 Unspecified infectious disease: Secondary | ICD-10-CM | POA: Diagnosis not present

## 2020-10-05 DIAGNOSIS — J3089 Other allergic rhinitis: Secondary | ICD-10-CM | POA: Diagnosis not present

## 2020-10-05 DIAGNOSIS — H81399 Other peripheral vertigo, unspecified ear: Secondary | ICD-10-CM | POA: Diagnosis not present

## 2020-10-10 ENCOUNTER — Ambulatory Visit (INDEPENDENT_AMBULATORY_CARE_PROVIDER_SITE_OTHER): Payer: 59 | Admitting: Clinical

## 2020-10-10 DIAGNOSIS — F419 Anxiety disorder, unspecified: Secondary | ICD-10-CM

## 2020-10-18 DIAGNOSIS — J101 Influenza due to other identified influenza virus with other respiratory manifestations: Secondary | ICD-10-CM | POA: Diagnosis not present

## 2020-10-18 DIAGNOSIS — Z03818 Encounter for observation for suspected exposure to other biological agents ruled out: Secondary | ICD-10-CM | POA: Diagnosis not present

## 2020-11-06 ENCOUNTER — Other Ambulatory Visit: Payer: Self-pay

## 2020-11-06 ENCOUNTER — Encounter (HOSPITAL_BASED_OUTPATIENT_CLINIC_OR_DEPARTMENT_OTHER): Payer: Self-pay | Admitting: Emergency Medicine

## 2020-11-06 ENCOUNTER — Emergency Department (HOSPITAL_BASED_OUTPATIENT_CLINIC_OR_DEPARTMENT_OTHER): Payer: Federal, State, Local not specified - PPO

## 2020-11-06 ENCOUNTER — Emergency Department (HOSPITAL_BASED_OUTPATIENT_CLINIC_OR_DEPARTMENT_OTHER)
Admission: EM | Admit: 2020-11-06 | Discharge: 2020-11-06 | Disposition: A | Discharge status: Home / Self Care | Payer: Federal, State, Local not specified - PPO | Attending: Emergency Medicine | Admitting: Emergency Medicine

## 2020-11-06 DIAGNOSIS — M9901 Segmental and somatic dysfunction of cervical region: Secondary | ICD-10-CM | POA: Diagnosis not present

## 2020-11-06 DIAGNOSIS — K219 Gastro-esophageal reflux disease without esophagitis: Secondary | ICD-10-CM | POA: Insufficient documentation

## 2020-11-06 DIAGNOSIS — K5792 Diverticulitis of intestine, part unspecified, without perforation or abscess without bleeding: Secondary | ICD-10-CM | POA: Insufficient documentation

## 2020-11-06 DIAGNOSIS — Z7951 Long term (current) use of inhaled steroids: Secondary | ICD-10-CM | POA: Diagnosis not present

## 2020-11-06 DIAGNOSIS — J45909 Unspecified asthma, uncomplicated: Secondary | ICD-10-CM | POA: Diagnosis not present

## 2020-11-06 DIAGNOSIS — R1032 Left lower quadrant pain: Secondary | ICD-10-CM | POA: Diagnosis not present

## 2020-11-06 DIAGNOSIS — K5730 Diverticulosis of large intestine without perforation or abscess without bleeding: Secondary | ICD-10-CM | POA: Diagnosis not present

## 2020-11-06 DIAGNOSIS — K6389 Other specified diseases of intestine: Secondary | ICD-10-CM | POA: Diagnosis not present

## 2020-11-06 LAB — COMPREHENSIVE METABOLIC PANEL
ALT: 11 U/L (ref 0–44)
AST: 12 U/L — ABNORMAL LOW (ref 15–41)
Albumin: 4 g/dL (ref 3.5–5.0)
Alkaline Phosphatase: 53 U/L (ref 38–126)
Anion gap: 8 (ref 5–15)
BUN: 10 mg/dL (ref 6–20)
CO2: 27 mmol/L (ref 22–32)
Calcium: 9 mg/dL (ref 8.9–10.3)
Chloride: 103 mmol/L (ref 98–111)
Creatinine, Ser: 0.57 mg/dL (ref 0.44–1.00)
GFR, Estimated: >60 mL/min (ref >60)
Glucose, Bld: 90 mg/dL (ref 70–99)
Potassium: 3.6 mmol/L (ref 3.5–5.1)
Sodium: 138 mmol/L (ref 135–145)
Total Bilirubin: 1.1 mg/dL (ref 0.3–1.2)
Total Protein: 6.4 g/dL — ABNORMAL LOW (ref 6.5–8.1)

## 2020-11-06 LAB — URINALYSIS, ROUTINE W REFLEX MICROSCOPIC
Bilirubin Urine: NEGATIVE
Glucose, UA: NEGATIVE mg/dL
Hgb urine dipstick: NEGATIVE
Ketones, ur: NEGATIVE mg/dL
Leukocytes,Ua: NEGATIVE
Nitrite: NEGATIVE
Protein, ur: NEGATIVE mg/dL
Specific Gravity, Urine: 1.008 (ref 1.005–1.030)
pH: 6 (ref 5.0–8.0)

## 2020-11-06 LAB — PREGNANCY, URINE: Preg Test, Ur: NEGATIVE

## 2020-11-06 LAB — CBC
HCT: 39.1 % (ref 36.0–46.0)
Hemoglobin: 12.8 g/dL (ref 12.0–15.0)
MCH: 29.8 pg (ref 26.0–34.0)
MCHC: 32.7 g/dL (ref 30.0–36.0)
MCV: 91.1 fL (ref 80.0–100.0)
Platelets: 246 10*3/uL (ref 150–400)
RBC: 4.29 MIL/uL (ref 3.87–5.11)
RDW: 14.3 % (ref 11.5–15.5)
WBC: 13 10*3/uL — ABNORMAL HIGH (ref 4.0–10.5)
nRBC: 0 % (ref 0.0–0.2)

## 2020-11-06 MED ORDER — SODIUM CHLORIDE 0.9 % IV SOLN: Freq: Once | INTRAVENOUS | Status: AC

## 2020-11-06 MED ORDER — CIPROFLOXACIN IN D5W 400 MG/200ML IV SOLN
400.0000 mg | Freq: Once | INTRAVENOUS | Status: AC
  Administered 2020-11-06: 400 mg via INTRAVENOUS
  Filled 2020-11-06: qty 200

## 2020-11-06 MED ORDER — METRONIDAZOLE 500 MG PO TABS: 500.0000 mg | ORAL_TABLET | Freq: Two times a day (BID) | ORAL | 0 refills | Status: DC

## 2020-11-06 MED ORDER — FENTANYL CITRATE (PF) 100 MCG/2ML IJ SOLN
50.0000 ug | Freq: Once | INTRAMUSCULAR | Status: AC
  Administered 2020-11-06: 50 ug via INTRAVENOUS
  Filled 2020-11-06: qty 2

## 2020-11-06 MED ORDER — CIPROFLOXACIN HCL 500 MG PO TABS: 500.0000 mg | ORAL_TABLET | Freq: Two times a day (BID) | ORAL | 0 refills | Status: DC

## 2020-11-06 MED ORDER — METRONIDAZOLE 500 MG/100ML IV SOLN
500.0000 mg | Freq: Once | INTRAVENOUS | Status: AC
  Administered 2020-11-06: 500 mg via INTRAVENOUS
  Filled 2020-11-06: qty 100

## 2020-11-06 MED ORDER — OXYCODONE-ACETAMINOPHEN 5-325 MG PO TABS
2.0000 | ORAL_TABLET | Freq: Once | ORAL | Status: AC
  Administered 2020-11-06: 2 via ORAL
  Filled 2020-11-06: qty 2

## 2020-11-06 NOTE — ED Provider Notes (Signed)
[facility] EMERGENCY DEPT Provider Note   CSN: [id number] Arrival date & time: 11/06/20  1233     History Chief Complaint  Patient presents with  . Abdominal Pain    Rachel Ferrell is a 40 y.o. female.  HPI     40 year old female presents today complaining of left-sided lower abdominal pain.  She states that it is sharp and coming in waves.  It started in the left lower quadrant but is now radiating into her right lower quadrant.  Denies nausea, vomiting, diarrhea, or dysuria but states it does hurt more when she bears down to urinate.  She was seen in urgent care center and had a negative pregnancy test and reportedly negative urinalysis there.  She denies any fever, chills, cough, chest pain and she has not had similar symptoms to this in the past.  Past Medical History:  Diagnosis Date  . Asthma   . GERD (gastroesophageal reflux disease)   . GERD (gastroesophageal reflux disease)     Patient Active Problem List   Diagnosis Date Noted  . URI (upper respiratory infection) 05/24/2011  . Chest pain 03/19/2011  . Asthma, persistent, severity to be determined 03/19/2011    History reviewed. No pertinent surgical history.   OB History   No obstetric history on file.     Family History  Problem Relation Age of Onset  . Hypertension Other     Social History   Tobacco Use  . Smoking status: Never Smoker  . Smokeless tobacco: Never Used  Substance Use Topics  . Alcohol use: No  . Drug use: No    Home Medications Prior to Admission medications   Medication Sig Start Date End Date Taking? Authorizing Provider  albuterol (PROVENTIL HFA;VENTOLIN HFA) 108 (90 BASE) MCG/ACT inhaler Inhale 2 puffs into the lungs every 6 (six) hours as needed for wheezing.    [provider]  benzonatate (TESSALON) 100 MG capsule Take 2 capsules (200 mg total) by mouth 2 (two) times daily as needed for cough. 12/28/15   [name] Dell, PA-C   Cholecalciferol (VITAMIN D) 2000 units tablet Take 2,000 Units by mouth See admin instructions. Take 1 tablet (2000 units) by mouth daily during winter months and 1 tablet (2000 units) every other day during summer months    [provider]  fluticasone (FLONASE) 50 MCG/ACT nasal spray Place 1 spray into both nostrils at bedtime.     [provider]  fluticasone furoate-vilanterol (BREO ELLIPTA) 200-25 MCG/INH AEPB Inhale 1 puff into the lungs daily.    [provider]  montelukast (SINGULAIR) 10 MG tablet Take 10 mg by mouth at bedtime.    [provider]  Multiple Vitamin (MULTIVITAMIN WITH MINERALS) TABS tablet Take 2 tablets by mouth 2 (two) times daily. [place] brand    [provider]  naproxen (NAPROSYN) 500 MG tablet Take 1 tablet (500 mg total) by mouth 2 (two) times daily. 12/28/15   [name] Dell, PA-C  pseudoephedrine (SUDAFED) 60 MG tablet Take 1 tablet (60 mg total) by mouth every 4 (four) hours as needed for congestion. 12/28/15   [name] Dell, PA-C    Allergies    Patient has no known allergies.  Review of Systems   Review of Systems  All other systems reviewed and are negative.   Physical Exam Updated Vital Signs BP 115/72   Pulse 80   Temp 99 F (37.2 C) (Oral)   Resp 16   Ht 1.676 m (  5\' 6" )   Wt 63.5 kg   SpO2 100%   BMI 22.60 kg/m   Physical Exam Vitals and nursing note reviewed.  Constitutional:      Appearance: She is well-developed.  HENT:     Head: Normocephalic and atraumatic.     Right Ear: External ear normal.     Left Ear: External ear normal.     Nose: Nose normal.  Eyes:     Conjunctiva/sclera: Conjunctivae normal.     Pupils: Pupils are equal, round, and reactive to light.  Cardiovascular:     Rate and Rhythm: Normal rate and regular rhythm.     Heart sounds: Normal heart sounds.  Pulmonary:     Effort: Pulmonary effort is normal.     Breath sounds: Normal breath  sounds.  Abdominal:     General: Bowel sounds are normal.     Palpations: Abdomen is soft.     Tenderness: There is abdominal tenderness in the right lower quadrant and left lower quadrant. There is no right CVA tenderness, left CVA tenderness, guarding or rebound.  Musculoskeletal:        General: Normal range of motion.     Cervical back: Normal range of motion and neck supple.  Skin:    General: Skin is warm and dry.  Neurological:     Mental Status: She is alert and oriented to person, place, and time.     Deep Tendon Reflexes: Reflexes are normal and symmetric.  Psychiatric:        Behavior: Behavior normal.        Thought Content: Thought content normal.        Judgment: Judgment normal.     ED Results / Procedures / Treatments   Labs (all labs ordered are listed, but only abnormal results are displayed) Labs Reviewed  CBC - Abnormal; Notable for the following components:      Result Value   WBC 13.0 (*)    All other components within normal limits  COMPREHENSIVE METABOLIC PANEL - Abnormal; Notable for the following components:   Total Protein 6.4 (*)    AST 12 (*)    All other components within normal limits  URINALYSIS, ROUTINE W REFLEX MICROSCOPIC - Abnormal; Notable for the following components:   Color, Urine COLORLESS (*)    All other components within normal limits  PREGNANCY, URINE    EKG None  Radiology CT ABDOMEN PELVIS WO CONTRAST  Result Date: 11/06/2020 CLINICAL DATA:  Lower abdominal pain worse on the left side. Nausea vomiting and diarrhea. EXAM: CT ABDOMEN AND PELVIS WITHOUT CONTRAST TECHNIQUE: Multidetector CT imaging of the abdomen and pelvis was performed following the standard protocol without IV contrast. COMPARISON:  None. FINDINGS: Lower chest: Unremarkable. Hepatobiliary: No focal abnormality in the liver on this study without intravenous contrast. There is no evidence for gallstones, gallbladder wall thickening, or pericholecystic fluid. No  intrahepatic or extrahepatic biliary dilation. Pancreas: No focal mass lesion. No dilatation of the main duct. No intraparenchymal cyst. No peripancreatic edema. Spleen: No splenomegaly. No focal mass lesion. Adrenals/Urinary Tract: No adrenal nodule or mass. No stones in either kidney or ureter. No secondary changes in either kidney or ureter. Bladder unremarkable without evidence of stones. Stomach/Bowel: Stomach is unremarkable. No gastric wall thickening. No evidence of outlet obstruction. Duodenum is normally positioned as is the ligament of Treitz. No small bowel wall thickening. No small bowel dilatation. The terminal ileum is normal. The appendix is normal. No gross colonic mass. Short  segment circumferential wall thickening noted in the proximal sigmoid colon with pericolonic edema and inflammation. There is some mild diverticular disease in this region and the edema/inflammation appears to be centered around a prominent ill-defined diverticulum. The involved colon measures approximately 4-5 cm in length and has relatively abrupt transition/shouldering from normal to abnormal wall thickness (see coronal image 57/5). There is no evidence for perforation or abscess. Vascular/Lymphatic: No abdominal aortic aneurysm. No abdominal aortic atherosclerotic calcification. There is no gastrohepatic or hepatoduodenal ligament lymphadenopathy. No retroperitoneal or mesenteric lymphadenopathy. No pelvic sidewall lymphadenopathy. Reproductive: The uterus is unremarkable.  There is no adnexal mass. Other: No intraperitoneal free fluid. Musculoskeletal: No worrisome lytic or sclerotic osseous abnormality. IMPRESSION: 1. Short segment circumferential wall thickening in the proximal sigmoid colon with pericolonic edema and inflammation. There is some mild diverticular disease in this region and the edema/inflammation appears to be centered around a prominent ill-defined diverticulum. Imaging features are most suggestive of  diverticulitis without perforation or abscess. The 4-5 cm of abnormal colon shows relatively abrupt transition and "shouldering" to normal bowel wall both proximally and distally and,m as such, GI follow-up suggested to ensure that this resolves and to exclude underlying mass lesion. Electronically Signed   By: Misty Stanley M.D.   On: 11/06/2020 14:31    Procedures Procedures   Medications Ordered in ED Medications  ciprofloxacin (CIPRO) IVPB 400 mg (has no administration in time range)  metroNIDAZOLE (FLAGYL) IVPB 500 mg (500 mg Intravenous New Bag/Given 11/06/20 1447)  0.9 %  sodium chloride infusion ( Intravenous New Bag/Given 11/06/20 1319)  fentaNYL (SUBLIMAZE) injection 50 mcg (50 mcg Intravenous Given 11/06/20 1321)    ED Course  I have reviewed the triage vital signs and the nursing notes.  Pertinent labs & imaging results that were available during my care of the patient were reviewed by me and considered in my medical decision making (see chart for details).    MDM Rules/Calculators/A&P                          40 year old female presents today complaining of pain that began the left lower quadrant yesterday.  Here on CT scan this appears to be most consistent with diverticulitis.  She has received Cipro and Flagyl.  She is not having nausea, vomiting, or diarrhea.  She has been p.o. hydrating without difficulty.  Pain is controlled except when she is moving around.  We have discussed treating this as inpatient versus outpatient.  Currently she would prefer outpatient treatment and this appears reasonable given her only mild leukocytosis and that she is tolerating p.o. intake without difficulty.  Plan Cipro and Flagyl.  We have discussed return precautions and need for close follow-up with her primary care doctor and she voices understanding. Final Clinical Impression(s) / ED Diagnoses Final diagnoses:  Diverticulitis    Rx / DC Orders ED Discharge Orders    None       Pattricia Boss, MD 11/06/20 1514

## 2020-11-06 NOTE — ED Triage Notes (Addendum)
Lower abd pain worse on the left side denies N/v/d dysuria, started yesterday comes in waves,  coming from [facility] in [place] today   Had preg test and they did  A urinine

## 2020-11-06 NOTE — ED Notes (Signed)
Patient verbalizes understanding of discharge instructions. Opportunity for questioning and answers were provided. Armband removed by staff, pt discharged from ED.  

## 2020-11-06 NOTE — Discharge Instructions (Addendum)
Please recheck with your doctor in 1 to 2 days Return to the emergency department immediately if you are having worsening pain, fever, or not tolerating taking things in by mouth Please make follow-up appointment with gastroenterology for recheck of area of abnormality noted on CAT scan

## 2020-11-08 ENCOUNTER — Encounter: Payer: Self-pay | Admitting: Gastroenterology

## 2020-11-13 ENCOUNTER — Ambulatory Visit: Payer: 59 | Admitting: Clinical

## 2020-12-05 ENCOUNTER — Ambulatory Visit (INDEPENDENT_AMBULATORY_CARE_PROVIDER_SITE_OTHER): Payer: Federal, State, Local not specified - PPO | Admitting: Clinical

## 2020-12-05 DIAGNOSIS — F419 Anxiety disorder, unspecified: Secondary | ICD-10-CM

## 2020-12-19 ENCOUNTER — Ambulatory Visit: Payer: Federal, State, Local not specified - PPO | Admitting: Gastroenterology

## 2020-12-19 ENCOUNTER — Encounter: Payer: Self-pay | Admitting: Gastroenterology

## 2020-12-19 VITALS — BP 98/58 | HR 72 | Ht 66.0 in | Wt 136.0 lb

## 2020-12-19 DIAGNOSIS — R9389 Abnormal findings on diagnostic imaging of other specified body structures: Secondary | ICD-10-CM

## 2020-12-19 DIAGNOSIS — K5792 Diverticulitis of intestine, part unspecified, without perforation or abscess without bleeding: Secondary | ICD-10-CM | POA: Diagnosis not present

## 2020-12-19 MED ORDER — PLENVU 140 G PO SOLR: ORAL | 0 refills | Status: DC

## 2020-12-19 NOTE — Patient Instructions (Signed)
If you are age 40 or older, your body mass index should be between 23-30. Your Body mass index is 21.95 kg/m. If this is out of the aforementioned range listed, please consider follow up with your Primary Care Provider.  If you are age 34 or younger, your body mass index should be between 19-25. Your Body mass index is 21.95 kg/m. If this is out of the aformentioned range listed, please consider follow up with your Primary Care Provider.   You have been scheduled for a colonoscopy. Please follow written instructions given to you at your visit today.  Please pick up your prep supplies at the pharmacy within the next 1-3 days. If you use inhalers (even only as needed), please bring them with you on the day of your procedure.  The [facility] providers would like to encourage you to use [facility] to communicate with providers for non-urgent requests or questions.  Due to long hold times on the telephone, sending your provider a message by [facility] may be a faster and more efficient way to get a response.  Please allow 48 business hours for a response.  Please remember that this is for non-urgent requests.   Due to recent changes in healthcare laws, you may see the results of your imaging and laboratory studies on MyChart before your provider has had a chance to review them.  We understand that in some cases there may be results that are confusing or concerning to you. Not all laboratory results come back in the same time frame and the provider may be waiting for multiple results in order to interpret others.  Please give [place] 48 hours in order for your provider to thoroughly review all the results before contacting the office for clarification of your results.   It was a pleasure to see you today!  Thank you for trusting me with your gastrointestinal care!    Scott E. Candis Schatz, MD

## 2020-12-19 NOTE — Progress Notes (Signed)
HPI : Rachel Ferrell is a very pleasant 40 year old female who was referred to gastroenterology after a recent diagnosis of sigmoid diverticulitis.  In late May, the patient developed lower abdominal pain which she thought was menstrual cramps.  The pain increased in intensity over the next day and she went to urgent care who subsequently referred her to the emergency room where she underwent a CT scan demonstrating diverticulosis with inflammatory changes in the sigmoid colon.  She was prescribed ciprofloxacin and Flagyl and experience clinical improvement in couple days.  Since that time, she has felt well and denies any recurrence of abdominal pain. She denies any history of similar symptoms and also denies any other chronic GI symptoms to include constipation, diarrhea or blood in her stool. She denies any family history of GI malignancy or diverticular disease. She also denies any chronic upper GI symptoms to include heartburn/acid regurgitation, dysphagia.  Of note, the patient received a letter in the mail from [facility] dated December 09, 2020 indicating that she had a "clinically significant incidental finding" noted on her CT scan from May 31.  She has received no further information on this finding and is wondering what needs to be done next.  Past Medical History:  Diagnosis Date   Asthma    Diverticulosis    GERD (gastroesophageal reflux disease)      Past Surgical History:  Procedure Laterality Date   SINUS IRRIGATION     Family History  Problem Relation Age of Onset   Hypertension Other    Cancer - Colon Neg Hx    Stomach cancer Neg Hx    Esophageal cancer Neg Hx    Pancreatic cancer Neg Hx    Social History   Tobacco Use   Smoking status: Never   Smokeless tobacco: Never  Vaping Use   Vaping Use: Never used  Substance Use Topics   Alcohol use: No   Drug use: No   Current Outpatient Medications  Medication Sig Dispense Refill   albuterol (PROVENTIL HFA;VENTOLIN  HFA) 108 (90 BASE) MCG/ACT inhaler Inhale 2 puffs into the lungs every 6 (six) hours as needed for wheezing.     Cholecalciferol (VITAMIN D) 2000 units tablet Take 2,000 Units by mouth See admin instructions. Take 1 tablet (2000 units) by mouth daily during winter months and 1 tablet (2000 units) every other day during summer months     fluticasone (FLONASE) 50 MCG/ACT nasal spray Place 1 spray into both nostrils at bedtime.      fluticasone furoate-vilanterol (BREO ELLIPTA) 200-25 MCG/INH AEPB Inhale 1 puff into the lungs daily.     montelukast (SINGULAIR) 10 MG tablet Take 10 mg by mouth at bedtime.     Multiple Vitamin (MULTIVITAMIN WITH MINERALS) TABS tablet Take 2 tablets by mouth 2 (two) times daily. Rachel Ferrell brand     Tiotropium Bromide Monohydrate (SPIRIVA RESPIMAT) 1.25 MCG/ACT AERS Take 1.25 mg by mouth daily. Inhale 2 puffs by mouth daily     No current facility-administered medications for this visit.   No Known Allergies   Review of Systems: All systems reviewed and negative except where noted in HPI.    No results found.  Physical Exam: BP (!) 98/58   Pulse 72   Ht 5\' 6"  (1.676 m)   Wt 136 lb (61.7 kg)   BMI 21.95 kg/m  Constitutional: Pleasant,well-developed, Caucasian female in no acute distress. HEENT: Normocephalic and atraumatic. Conjunctivae are normal. No scleral icterus. Cardiovascular: Normal rate, regular rhythm.  Pulmonary/chest: Effort normal and breath sounds normal. No wheezing, rales or rhonchi. Abdominal: Soft, nondistended, nontender. Bowel sounds active throughout. There are no masses palpable. No hepatomegaly. Extremities: no edema Neurological: Alert and oriented to person place and time. Skin: Skin is warm and dry. No rashes noted. Psychiatric: Normal mood and affect. Behavior is normal.  CBC    Component Value Date/Time   WBC 13.0 (H) 11/06/2020 1315   RBC 4.29 11/06/2020 1315   HGB 12.8 11/06/2020 1315   HCT 39.1 11/06/2020 1315    PLT 246 11/06/2020 1315   MCV 91.1 11/06/2020 1315   MCH 29.8 11/06/2020 1315   MCHC 32.7 11/06/2020 1315   RDW 14.3 11/06/2020 1315    CMP     Component Value Date/Time   NA 138 11/06/2020 1315   K 3.6 11/06/2020 1315   CL 103 11/06/2020 1315   CO2 27 11/06/2020 1315   GLUCOSE 90 11/06/2020 1315   BUN 10 11/06/2020 1315   CREATININE 0.57 11/06/2020 1315   CALCIUM 9.0 11/06/2020 1315   PROT 6.4 (L) 11/06/2020 1315   ALBUMIN 4.0 11/06/2020 1315   AST 12 (L) 11/06/2020 1315   ALT 11 11/06/2020 1315   ALKPHOS 53 11/06/2020 1315   BILITOT 1.1 11/06/2020 1315   GFRNONAA >60 11/06/2020 1315   GFRAA >60 12/28/2015 1045   CLINICAL DATA:  Lower abdominal pain worse on the left side. Nausea vomiting and diarrhea.   EXAM: CT ABDOMEN AND PELVIS WITHOUT CONTRAST   TECHNIQUE: Multidetector CT imaging of the abdomen and pelvis was performed following the standard protocol without IV contrast.   COMPARISON:  None.   FINDINGS: Lower chest: Unremarkable.   Hepatobiliary: No focal abnormality in the liver on this study without intravenous contrast. There is no evidence for gallstones, gallbladder wall thickening, or pericholecystic fluid. No intrahepatic or extrahepatic biliary dilation.   Pancreas: No focal mass lesion. No dilatation of the main duct. No intraparenchymal cyst. No peripancreatic edema.   Spleen: No splenomegaly. No focal mass lesion.   Adrenals/Urinary Tract: No adrenal nodule or mass. No stones in either kidney or ureter. No secondary changes in either kidney or ureter. Bladder unremarkable without evidence of stones.   Stomach/Bowel: Stomach is unremarkable. No gastric wall thickening. No evidence of outlet obstruction. Duodenum is normally positioned as is the ligament of Treitz. No small bowel wall thickening. No small bowel dilatation. The terminal ileum is normal. The appendix is normal. No gross colonic mass. Short segment circumferential  wall thickening noted in the proximal sigmoid colon with pericolonic edema and inflammation. There is some mild diverticular disease in this region and the edema/inflammation appears to be centered around a prominent ill-defined diverticulum. The involved colon measures approximately 4-5 cm in length and has relatively abrupt transition/shouldering from normal to abnormal wall thickness (see coronal image 57/5). There is no evidence for perforation or abscess.   Vascular/Lymphatic: No abdominal aortic aneurysm. No abdominal aortic atherosclerotic calcification. There is no gastrohepatic or hepatoduodenal ligament lymphadenopathy. No retroperitoneal or mesenteric lymphadenopathy. No pelvic sidewall lymphadenopathy.   Reproductive: The uterus is unremarkable.  There is no adnexal mass.   Other: No intraperitoneal free fluid.   Musculoskeletal: No worrisome lytic or sclerotic osseous abnormality.   IMPRESSION: 1. Short segment circumferential wall thickening in the proximal sigmoid colon with pericolonic edema and inflammation. There is some mild diverticular disease in this region and the edema/inflammation appears to be centered around a prominent ill-defined diverticulum. Imaging features are most suggestive of diverticulitis  without perforation or abscess. The 4-5 cm of abnormal colon shows relatively abrupt transition and "shouldering" to normal bowel wall both proximally and distally and,m as such, GI follow-up suggested to ensure that this resolves and to exclude underlying mass lesion.     Electronically Signed   By: Misty Stanley M.D.   On: 11/06/2020 14:31   ASSESSMENT AND PLAN: 40 year old female with symptoms of and imaging consistent with uncomplicated sigmoid diverticulitis in late May of this year.  Currently asymptomatic.  No history of similar symptoms.  No chronic GI symptoms.  No red flag symptoms.  Colonoscopy is still indicated to rule out malignancy.  We  will schedule for routine colonoscopy.  Diverticulitis, uncomplicated - Colonoscopy - Discussed fiber supplementation as potential intervention to reduce subsequent episodes of diverticulitis  The details, risks (including bleeding, perforation, infection, missed lesions, medication reactions and possible hospitalization or surgery if complications occur), benefits, and alternatives to colonoscopy with possible biopsy and possible polypectomy were discussed with the patient and she consents to proceed.   Regarding the letter that the patient received concerning the "incidental finding", I contacted the reading radiologist for the study, Dr. Tery Sanfilippo.  He was not aware of any incidental findings on the study.  He reviewed the CT scan again and did not note any incidental findings, only the known diverticulitis.  It is unclear why the patient received this letter.  Dr. Tery Sanfilippo informed me he would raise this issue with his supervisors in the radiology department.  I spoke with the patient after my conversation with Dr. Tery Sanfilippo and informed her that there were no incidental findings that could be identified, and that she may have received a letter in error.    [facility] [place] *

## 2020-12-31 DIAGNOSIS — N898 Other specified noninflammatory disorders of vagina: Secondary | ICD-10-CM | POA: Diagnosis not present

## 2020-12-31 DIAGNOSIS — R102 Pelvic and perineal pain: Secondary | ICD-10-CM | POA: Diagnosis not present

## 2021-01-14 DIAGNOSIS — U099 Post covid-19 condition, unspecified: Secondary | ICD-10-CM | POA: Diagnosis not present

## 2021-01-14 DIAGNOSIS — R051 Acute cough: Secondary | ICD-10-CM | POA: Diagnosis not present

## 2021-01-18 ENCOUNTER — Encounter: Payer: Federal, State, Local not specified - PPO | Admitting: Gastroenterology

## 2021-01-28 ENCOUNTER — Ambulatory Visit: Payer: Federal, State, Local not specified - PPO | Admitting: Clinical

## 2021-02-04 ENCOUNTER — Ambulatory Visit (INDEPENDENT_AMBULATORY_CARE_PROVIDER_SITE_OTHER): Payer: Federal, State, Local not specified - PPO | Admitting: Clinical

## 2021-02-04 ENCOUNTER — Ambulatory Visit: Payer: Federal, State, Local not specified - PPO | Admitting: Clinical

## 2021-02-04 DIAGNOSIS — F419 Anxiety disorder, unspecified: Secondary | ICD-10-CM | POA: Diagnosis not present

## 2021-02-08 ENCOUNTER — Other Ambulatory Visit: Payer: Self-pay

## 2021-02-08 ENCOUNTER — Encounter: Payer: Self-pay | Admitting: Gastroenterology

## 2021-02-08 ENCOUNTER — Ambulatory Visit (AMBULATORY_SURGERY_CENTER): Payer: Federal, State, Local not specified - PPO | Admitting: Gastroenterology

## 2021-02-08 VITALS — BP 100/60 | HR 59 | Temp 98.0°F | Resp 18 | Ht 66.0 in | Wt 136.0 lb

## 2021-02-08 DIAGNOSIS — K635 Polyp of colon: Secondary | ICD-10-CM

## 2021-02-08 DIAGNOSIS — D122 Benign neoplasm of ascending colon: Secondary | ICD-10-CM

## 2021-02-08 DIAGNOSIS — Z1211 Encounter for screening for malignant neoplasm of colon: Secondary | ICD-10-CM | POA: Diagnosis not present

## 2021-02-08 DIAGNOSIS — K5730 Diverticulosis of large intestine without perforation or abscess without bleeding: Secondary | ICD-10-CM | POA: Diagnosis not present

## 2021-02-08 DIAGNOSIS — K5792 Diverticulitis of intestine, part unspecified, without perforation or abscess without bleeding: Secondary | ICD-10-CM

## 2021-02-08 DIAGNOSIS — D128 Benign neoplasm of rectum: Secondary | ICD-10-CM

## 2021-02-08 DIAGNOSIS — K621 Rectal polyp: Secondary | ICD-10-CM | POA: Diagnosis not present

## 2021-02-08 MED ORDER — SODIUM CHLORIDE 0.9 % IV SOLN: 500.0000 mL | Freq: Once | INTRAVENOUS | Status: DC

## 2021-02-08 NOTE — Progress Notes (Signed)
Called to room to assist during endoscopic procedure.  Patient ID and intended procedure confirmed with present staff. Received instructions for my participation in the procedure from the performing physician.  

## 2021-02-08 NOTE — Op Note (Signed)
[facility] Patient Name: Rachel Ferrell Procedure Date: 02/08/2021 9:15 AM MRN: [id number] Endoscopist: Nicki Reaper E. Candis Schatz , MD Age: 39 Referring MD:  Date of Birth: 1980/11/21 Gender: Female Account #: [id number] Procedure:                Colonoscopy Indications:              Follow-up of diverticulitis Medicines:                Monitored Anesthesia Care Procedure:                Pre-Anesthesia Assessment:                           - Prior to the procedure, a History and Physical                            was performed, and patient medications and                            allergies were reviewed. The patient's tolerance of                            previous anesthesia was also reviewed. The risks                            and benefits of the procedure and the sedation                            options and risks were discussed with the patient.                            All questions were answered, and informed consent                            was obtained. Prior Anticoagulants: The patient has                            taken no previous anticoagulant or antiplatelet                            agents. ASA Grade Assessment: I - A normal, healthy                            patient. After reviewing the risks and benefits,                            the patient was deemed in satisfactory condition to                            undergo the procedure.                           After obtaining informed consent, the colonoscope  was passed under direct vision. Throughout the                            procedure, the patient's blood pressure, pulse, and                            oxygen saturations were monitored continuously. The                            Olympus [id number]) Colonoscope was                            introduced through the anus and advanced to the the                            terminal ileum, with identification of  the                            appendiceal orifice and IC valve. The colonoscopy                            was performed without difficulty. The patient                            tolerated the procedure well. The quality of the                            bowel preparation was excellent. The terminal                            ileum, ileocecal valve, appendiceal orifice, and                            rectum were photographed. Scope In: 9:26:05 AM Scope Out: 9:46:55 AM Scope Withdrawal Time: 0 hours 16 minutes 23 seconds  Total Procedure Duration: 0 hours 20 minutes 50 seconds  Findings:                 The perianal and digital rectal examinations were                            normal. Pertinent negatives include normal                            sphincter tone and no palpable rectal lesions.                           A 10 mm polyp was found in the ascending colon. The                            polyp was sessile. The polyp was removed with a                            cold snare. Resection and retrieval were complete.  Estimated blood loss was minimal.                           Four sessile polyps were found in the rectum. The                            polyps were 2 to 3 mm in size. These polyps were                            removed with a cold biopsy forceps. Resection and                            retrieval were complete. Estimated blood loss was                            minimal.                           Multiple small-mouthed diverticula were found in                            the sigmoid colon and descending colon.                           The exam was otherwise normal throughout the                            examined colon.                           The terminal ileum appeared normal.                           The retroflexed view of the distal rectum and anal                            verge was normal and showed no anal or rectal                             abnormalities. Complications:            No immediate complications. Estimated Blood Loss:     Estimated blood loss was minimal. Impression:               - One 10 mm polyp in the ascending colon, removed                            with a cold snare. Resected and retrieved. Suspect                            this is a sessile serrated adenoma.                           - Four 2 to 3 mm polyps in the rectum, removed with  a cold biopsy forceps. Resected and retrieved.                            Suspect these are hyperplastic polyps.                           - Diverticulosis in the sigmoid colon and in the                            descending colon. No evidence of diverticulitis.                           - The examined portion of the ileum was normal.                           - The distal rectum and anal verge are normal on                            retroflexion view. Recommendation:           - Patient has a contact number available for                            emergencies. The signs and symptoms of potential                            delayed complications were discussed with the                            patient. Return to normal activities tomorrow.                            Written discharge instructions were provided to the                            patient.                           - Resume previous diet.                           - Continue present medications.                           - Await pathology results.                           - Repeat colonoscopy (date not yet determined) for                            surveillance based on pathology results. Yaffa Seckman E. Candis Schatz, MD 02/08/2021 9:53:03 AM This report has been signed electronically.

## 2021-02-08 NOTE — Patient Instructions (Signed)
Read all of the handouts given to you by your recovery room nurse.  YOU HAD AN ENDOSCOPIC PROCEDURE TODAY AT THE [facility]:   Refer to the procedure report that was given to you for any specific questions about what was found during the examination.  If the procedure report does not answer your questions, please call your gastroenterologist to clarify.  If you requested that your care partner not be given the details of your procedure findings, then the procedure report has been included in a sealed envelope for you to review at your convenience later.  YOU SHOULD EXPECT: Some feelings of bloating in the abdomen. Passage of more gas than usual.  Walking can help get rid of the air that was put into your GI tract during the procedure and reduce the bloating. If you had a lower endoscopy (such as a colonoscopy or flexible sigmoidoscopy) you may notice spotting of blood in your stool or on the toilet paper. If you underwent a bowel prep for your procedure, you may not have a normal bowel movement for a few days.  Please Note:  You might notice some irritation and congestion in your nose or some drainage.  This is from the oxygen used during your procedure.  There is no need for concern and it should clear up in a day or so.  SYMPTOMS TO REPORT IMMEDIATELY:  Following lower endoscopy (colonoscopy or flexible sigmoidoscopy):  Excessive amounts of blood in the stool  Significant tenderness or worsening of abdominal pains  Swelling of the abdomen that is new, acute  Fever of 100F or higher   For urgent or emergent issues, a gastroenterologist can be reached at any hour by calling [phone]. Do not use MyChart messaging for urgent concerns.    DIET:  We do recommend a small meal at first, but then you may proceed to your regular diet.  Drink plenty of fluids but you should avoid alcoholic beverages for 24 hours. Try to increase the fiber in your diet, and drink plenty of  water.  ACTIVITY:  You should plan to take it easy for the rest of today and you should NOT DRIVE or use heavy machinery until tomorrow (because of the sedation medicines used during the test).    FOLLOW UP: Our staff will call the number listed on your records 48-72 hours following your procedure to check on you and address any questions or concerns that you may have regarding the information given to you following your procedure. If we do not reach you, we will leave a message.  We will attempt to reach you two times.  During this call, we will ask if you have developed any symptoms of COVID 19. If you develop any symptoms (ie: fever, flu-like symptoms, shortness of breath, cough etc.) before then, please call [phone].  If you test positive for Covid 19 in the 2 weeks post procedure, please call and report this information to us.    If any biopsies were taken you will be contacted by phone or by letter within the next 1-3 weeks.  Please call us at [phone] if you have not heard about the biopsies in 3 weeks.    SIGNATURES/CONFIDENTIALITY: You and/or your care partner have signed paperwork which will be entered into your electronic medical record.  These signatures attest to the fact that that the information above on your After Visit Summary has been reviewed and is understood.  Full responsibility of the confidentiality of this   discharge information lies with you and/or your care-partner.  

## 2021-02-08 NOTE — Progress Notes (Signed)
VS completed by CW.   Medical history reviewed and updated.

## 2021-02-08 NOTE — Progress Notes (Signed)
[facility] History and Physical   Primary Care Physician:  [facility] [place]   Reason for Procedure:   History of diverticulitis  Plan:    Colonoscopy to exclude neoplasm     HPI: Rachel Ferrell is a 40 y.o. female who was diagnosed with uncomplicated sigmoid diverticulitis in late May 2022.  She was treated with antibiotics with improvement in her abdominal pain.  She has not had recurrence of her abdominal pain and she denies any chronic GI symptoms.  No family history of GI malignancy   Past Medical History:  Diagnosis Date   Asthma    Diverticulosis    GERD (gastroesophageal reflux disease)     Past Surgical History:  Procedure Laterality Date   SINUS IRRIGATION      Prior to Admission medications   Medication Sig Start Date End Date Taking? Authorizing Provider  Cholecalciferol (VITAMIN D) 2000 units tablet Take 2,000 Units by mouth See admin instructions. Take 1 tablet (2000 units) by mouth daily during winter months and 1 tablet (2000 units) every other day during summer months   Yes [provider]  fluticasone (FLONASE) 50 MCG/ACT nasal spray Place 1 spray into both nostrils at bedtime.    Yes [provider]  fluticasone furoate-vilanterol (BREO ELLIPTA) 200-25 MCG/INH AEPB Inhale 1 puff into the lungs daily.   Yes [provider]  montelukast (SINGULAIR) 10 MG tablet Take 10 mg by mouth at bedtime.   Yes [provider]  Multiple Vitamin (MULTIVITAMIN WITH MINERALS) TABS tablet Take 2 tablets by mouth 2 (two) times daily. [place] brand   Yes [provider]  Tiotropium Bromide Monohydrate (SPIRIVA RESPIMAT) 1.25 MCG/ACT AERS Take 1.25 mg by mouth daily. Inhale 2 puffs by mouth daily 01/22/18  Yes [provider]  albuterol (PROVENTIL HFA;VENTOLIN HFA) 108 (90 BASE) MCG/ACT inhaler Inhale 2 puffs into the lungs every 6 (six) hours as needed for wheezing.    [provider]     Current Outpatient Medications  Medication Sig Dispense Refill   Cholecalciferol (VITAMIN D) 2000 units tablet Take 2,000 Units by mouth See admin instructions. Take 1 tablet (2000 units) by mouth daily during winter months and 1 tablet (2000 units) every other day during summer months     fluticasone (FLONASE) 50 MCG/ACT nasal spray Place 1 spray into both nostrils at bedtime.      fluticasone furoate-vilanterol (BREO ELLIPTA) 200-25 MCG/INH AEPB Inhale 1 puff into the lungs daily.     montelukast (SINGULAIR) 10 MG tablet Take 10 mg by mouth at bedtime.     Multiple Vitamin (MULTIVITAMIN WITH MINERALS) TABS tablet Take 2 tablets by mouth 2 (two) times daily. [facility] [employer] brand     Tiotropium Bromide Monohydrate (SPIRIVA RESPIMAT) 1.25 MCG/ACT AERS Take 1.25 mg by mouth daily. Inhale 2 puffs by mouth daily     albuterol (PROVENTIL HFA;VENTOLIN HFA) 108 (90 BASE) MCG/ACT inhaler Inhale 2 puffs into the lungs every 6 (six) hours as needed for wheezing.     Current Facility-Administered Medications  Medication Dose Route Frequency Provider Last Rate Last Admin   0.9 %  sodium chloride infusion  500 mL Intravenous Once Daryel November, MD        Allergies as of 02/08/2021   (No Known Allergies)    Family History  Problem Relation Age of Onset   Hypertension Other    Cancer - Colon Neg Hx    Stomach cancer Neg Hx    Esophageal cancer  Neg Hx    Pancreatic cancer Neg Hx    Colon cancer Neg Hx    Rectal cancer Neg Hx     Social History   Socioeconomic History   Marital status: Married    Spouse name: Not on file   Number of children: 2   Years of education: Not on file   Highest education level: Not on file  Occupational History    Employer: [employer]    Comment: Audits medical claims  Tobacco Use   Smoking status: Never   Smokeless tobacco: Never  Vaping Use   Vaping Use: Never used  Substance and Sexual Activity   Alcohol use: No   Drug use: No   Sexual  activity: Yes    Birth control/protection: None  Other Topics Concern   Not on file  Social History Narrative   Not on file   Social Determinants of Health   Financial Resource Strain: Not on file  Food Insecurity: Not on file  Transportation Needs: Not on file  Physical Activity: Not on file  Stress: Not on file  Social Connections: Not on file  Intimate Partner Violence: Not on file    Review of Systems: All other review of systems negative except as mentioned in the HPI.  Physical Exam: Vital signs BP 107/68   Pulse 98   Temp 98 F (36.7 C)   Ht '5\' 6"'$  (1.676 m)   Wt 136 lb (61.7 kg)   SpO2 99%   BMI 21.95 kg/m   General:   Alert,  Well-developed, well-nourished, pleasant Caucasian female in NAD, MP1 Lungs:  Clear throughout to auscultation.   Heart:  Regular rate and rhythm; no murmurs, clicks, rubs,  or gallops. Abdomen:  Soft, nontender and nondistended. Normal bowel sounds.   Neuro/Psych:  Alert and cooperative. Normal mood and affect. A and O x 3   Rachel Ferrell E. Candis Schatz, MD [facility]

## 2021-02-08 NOTE — Progress Notes (Signed)
Vss nad pt resting transferred to pacu

## 2021-02-12 ENCOUNTER — Telehealth: Payer: Self-pay

## 2021-02-12 NOTE — Telephone Encounter (Signed)
  Follow up Call-  Call back number 02/08/2021  Post procedure Call Back phone  # [phone]  Permission to leave phone message Yes  Some recent data might be hidden     Patient questions:  Do you have a fever, pain , or abdominal swelling? No. Pain Score  0 *  Have you tolerated food without any problems? Yes.    Have you been able to return to your normal activities? Yes.    Do you have any questions about your discharge instructions: Diet   No. Medications  No. Follow up visit  No.  Do you have questions or concerns about your Care? No.  Actions: * If pain score is 4 or above: No action needed, pain <4.

## 2021-02-14 ENCOUNTER — Encounter: Payer: Self-pay | Admitting: Gastroenterology

## 2021-03-11 ENCOUNTER — Ambulatory Visit (INDEPENDENT_AMBULATORY_CARE_PROVIDER_SITE_OTHER): Payer: Federal, State, Local not specified - PPO | Admitting: Clinical

## 2021-03-11 DIAGNOSIS — F419 Anxiety disorder, unspecified: Secondary | ICD-10-CM | POA: Diagnosis not present

## 2021-04-04 DIAGNOSIS — Z03818 Encounter for observation for suspected exposure to other biological agents ruled out: Secondary | ICD-10-CM | POA: Diagnosis not present

## 2021-04-04 DIAGNOSIS — J019 Acute sinusitis, unspecified: Secondary | ICD-10-CM | POA: Diagnosis not present

## 2021-04-04 DIAGNOSIS — R051 Acute cough: Secondary | ICD-10-CM | POA: Diagnosis not present

## 2021-04-10 ENCOUNTER — Telehealth: Payer: Self-pay | Admitting: Gastroenterology

## 2021-04-10 MED ORDER — DICYCLOMINE HCL 20 MG PO TABS: ORAL_TABLET | ORAL | 0 refills | Status: DC

## 2021-04-10 MED ORDER — AMOXICILLIN-POT CLAVULANATE 875-125 MG PO TABS: 1.0000 | ORAL_TABLET | Freq: Two times a day (BID) | ORAL | 0 refills | Status: DC

## 2021-04-10 NOTE — Telephone Encounter (Signed)
Spoke with pt and she reports she had her 1st diverticulitis flare in June. Pt reports she is having some of the same symptoms and is afraid she may be having another flare. Reports she started having LLQ abd pain this am and it seems to be getting worse. She does not have a fever and reports no changes in her stools. Please advise.

## 2021-04-10 NOTE — Telephone Encounter (Signed)
Scripts sent to pharmacy per Dr Dayle Points request.

## 2021-04-10 NOTE — Telephone Encounter (Signed)
Patient called said she is starting to get abdominal pain again like a Diverticulitis flare up seeking advise.

## 2021-04-25 ENCOUNTER — Ambulatory Visit (INDEPENDENT_AMBULATORY_CARE_PROVIDER_SITE_OTHER): Payer: Federal, State, Local not specified - PPO | Admitting: Clinical

## 2021-04-25 DIAGNOSIS — F419 Anxiety disorder, unspecified: Secondary | ICD-10-CM | POA: Diagnosis not present

## 2021-05-14 DIAGNOSIS — K080 Exfoliation of teeth due to systemic causes: Secondary | ICD-10-CM | POA: Diagnosis not present

## 2021-05-16 ENCOUNTER — Ambulatory Visit (INDEPENDENT_AMBULATORY_CARE_PROVIDER_SITE_OTHER): Payer: Federal, State, Local not specified - PPO | Admitting: Clinical

## 2021-05-16 DIAGNOSIS — F411 Generalized anxiety disorder: Secondary | ICD-10-CM | POA: Diagnosis not present

## 2021-05-16 NOTE — Progress Notes (Signed)
Diagnosis: Anxiety NOS, F41.9 Session time: 24:[id number]:96QI CPT code: [id number]  Callista was seen using secure video conferencing. She was in her home and the therapist was in her office at the time of the appointment. She shared that she has been stable overall since her last session, and had applied to two jobs that she was waiting to hear back from. Session focused on her functioning in social situations, and sense that she has to mask her natural behavior in order to function socially. Therapist suggested working toward social relationships that do not require her to do this. She is scheduled to be seen again in February, and therapist will reach out as cancellations happen.  Objectives Related Problem: Petra experiences anxiety related to her son's well-being, as well as difficulty processing emotions relating to current situations and past traumas Description: Pearline will develop strategies to help her regulate and process her emotions, including her anxiety and grief Target Date: 2021-12-07 Frequency: Biweekly Modality: individual Progress: 20% Planned Intervention: Therapist will help Takeila to identify and disengage from negative thought patterns using CBT-based strategies  Planned Intervention: Therapist will provide Dachelle with referrals to additional resources as appropriate  Related Problem: Ren experiences anxiety related to her son's well-being, as well as difficulty processing emotions relating to current situations and past traumas Description: Kenlynn will be provided with opportunities to process past traumatic experiences Target Date: 2021-12-07 Frequency: Daily Modality: individual Progress: 30% Planned Intervention: Therapist will provide Kiandria with opportunities to process her experiences in session   Treatment Plan Client Abilities/Strengths  Nylee presented as insightful and motivated to improve her situation.  Client Treatment Preferences  Mahrosh reported that she has  found therapy most helpful in the past when she worked with a therapist who confronted and challenged her.  Client Statement of Needs  Kameria is seeking CBT to help her manage anxiety related to current stressors and past traumas.  Treatment Level  Biweekly  Symptoms  Anxiety: worried thoughts, difficulty relaxing, fears for the future, poor self-image, emerging social anxiety (Status: maintained). Depression: uncontrollable tearfulness, weight gain, emotional eating, depressed mood (Status: maintained).  Problems Addressed  New Description, New Description  Goals 1. Priyanka experiences anxiety related to her son's well-being, as well as difficulty processing emotions relating to current situations and past traumas Objective Anniah will be provided with opportunities to process past traumatic experiences Target Date: 2021-12-07 Frequency: Daily  Progress: 30 Modality: individual  Related Interventions Therapist will help Tedi to process her relationships with family members in order to better understand how she may be unwittingly repeating negative patterns from her past Therapist will provide Marsia with opportunities to process her experiences in session Objective Marajade will develop strategies to help her regulate and process her emotions, including her anxiety and grief Target Date: 2021-12-07 Frequency: Biweekly  Progress: 20 Modality: individual  Related Interventions Therapist will help Ressie to identify and assert appropriate boundaries in her relationships, as well as provide her with communication strategies to help her communicate her thoughts and feelings Therapist will provide Koula with referrals to additional resources as appropriate Therapist will provide Allessandra with strategies to help her regulate her emotions, including deep breathing, meditation, mindfulness, and self-care Therapist will help Briseis to identify and disengage from negative thought patterns using CBT-based  strategies Therapist will engage Lacee in behavior activation, including incorporation of pleasant and mastery events into her daily routine 2. Kimiah experiences grief and depressive symptoms related to her father's passing and her challenging relationship with her mother  Diagnosis Axis none 300.00 (Anxiety state, unspecified) - Open - [Signifier: n/a]    Conditions For Discharge Achievement of treatment goals and objectives        [name], PhD

## 2021-06-18 ENCOUNTER — Ambulatory Visit (INDEPENDENT_AMBULATORY_CARE_PROVIDER_SITE_OTHER): Payer: Federal, State, Local not specified - PPO | Admitting: Clinical

## 2021-06-18 DIAGNOSIS — J3089 Other allergic rhinitis: Secondary | ICD-10-CM | POA: Diagnosis not present

## 2021-06-18 DIAGNOSIS — F411 Generalized anxiety disorder: Secondary | ICD-10-CM

## 2021-06-18 DIAGNOSIS — J4541 Moderate persistent asthma with (acute) exacerbation: Secondary | ICD-10-CM | POA: Diagnosis not present

## 2021-06-18 DIAGNOSIS — K219 Gastro-esophageal reflux disease without esophagitis: Secondary | ICD-10-CM | POA: Diagnosis not present

## 2021-06-18 DIAGNOSIS — B999 Unspecified infectious disease: Secondary | ICD-10-CM | POA: Diagnosis not present

## 2021-06-18 NOTE — Progress Notes (Signed)
Diagnosis: Anxiety NOS, F41.9 Session time: 19:37TK-24:09BD CPT code: [phone]  Rachel Ferrell was seen using secure video conferencing. She was in her home and the therapist was in her office at the time of the appointment. 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 Session focused on her experience over the holidays of losing her grandfather and grieving her father. Session also focused on her sense of feeling lost, and setting an intention to get to know and like herself better this year. Therapist encouraged her to identify values (grace) to allow her to reach her goal (improved self-esteem), and Rachel Ferrell as able to come up with several exercises to help her practice more grace with herself (journaling and meditation). She is scheduled to be seen again in 6 weeks. Objectives Related Problem: Rachel Ferrell experiences anxiety related to her son's well-being, as well as difficulty processing emotions relating to current situations and past traumas Description: Rachel Ferrell will develop strategies to help her regulate and process her emotions, including her anxiety and grief Target Date: 2021-12-07 Frequency: Biweekly Modality: individual Progress: 20% Planned Intervention: Therapist will help Rachel Ferrell to identify and disengage from negative thought patterns using CBT-based strategies  Planned Intervention: Therapist will provide Rachel Ferrell with referrals to additional resources as appropriate  Related Problem: Rachel Ferrell experiences anxiety related to her son's well-being, as well as difficulty processing emotions relating to current situations and past traumas Description: Rachel Ferrell will be provided with opportunities to process past traumatic experiences Target Date: 2021-12-07 Frequency: Daily Modality: individual Progress: 30% Planned Intervention: Therapist will provide Rachel Ferrell with opportunities to process her experiences  in session   Treatment Plan Client Abilities/Strengths  Rachel Ferrell presented as insightful and motivated to improve her situation.  Client Treatment Preferences  Rachel Ferrell reported that she has found therapy most helpful in the past when she worked with a therapist who confronted and challenged her.  Client Statement of Needs  Rachel Ferrell is seeking CBT to help her manage anxiety related to current stressors and past traumas.  Treatment Level  Biweekly  Symptoms  Anxiety: worried thoughts, difficulty relaxing, fears for the future, poor self-image, emerging social anxiety (Status: maintained). Depression: uncontrollable tearfulness, weight gain, emotional eating, depressed mood (Status: maintained).  Problems Addressed  New Description, New Description  Goals 1. Rachel Ferrell experiences anxiety related to her son's well-being, as well as difficulty processing emotions relating to current situations and past traumas Objective Rachel Ferrell will be provided with opportunities to process past traumatic experiences Target Date: 2021-12-07 Frequency: Daily  Progress: 30 Modality: individual  Related Interventions Therapist will help Rachel Ferrell to process her relationships with family members in order to better understand how she may be unwittingly repeating negative patterns from her past Therapist will provide Rachel Ferrell with opportunities to process her experiences in session Objective Rachel Ferrell will develop strategies to help her regulate and process her emotions, including her anxiety and grief Target Date: 2021-12-07 Frequency: Biweekly  Progress: 20 Modality: individual  Related Interventions Therapist will help Rachel Ferrell to identify and assert appropriate boundaries in her relationships, as well as provide her with communication strategies to help her communicate her thoughts and feelings Therapist will provide Rachel Ferrell with referrals to additional resources as appropriate Therapist will provide Rachel Ferrell with strategies to help her  regulate her emotions, including deep breathing, meditation, mindfulness, and self-care Therapist will help Rachel Ferrell to identify and disengage from negative thought patterns using CBT-based strategies Therapist will engage Rachel Ferrell in behavior  activation, including incorporation of pleasant and mastery events into her daily routine 2. Rachel Ferrell experiences grief and depressive symptoms related to her father's passing and her challenging relationship with her mother Diagnosis Axis none 300.00 (Anxiety state, unspecified) - Open - [Signifier: n/a]    Conditions For Discharge Achievement of treatment goals and objectives        [name], PhD

## 2021-06-20 DIAGNOSIS — J4541 Moderate persistent asthma with (acute) exacerbation: Secondary | ICD-10-CM | POA: Diagnosis not present

## 2021-07-09 IMAGING — CT CT ABD-PELV W/O CM
2 of 4 series · 15 of 46 positions shown, 17 images · non-contrast
Comparison: None.

CLINICAL DATA: Lower abdominal pain worse on the left side. Nausea
vomiting and diarrhea.

EXAM:
CT ABDOMEN AND PELVIS WITHOUT CONTRAST
TECHNIQUE: Multidetector CT imaging of the abdomen and pelvis was performed
following the standard protocol without IV contrast.

[Series 2: abd pel wo · axial · 0.70mm/px · z∈[+877,+1262]mm · 12 of 91 slices shown, 14 images]
[im 7/91  soft-tissue]
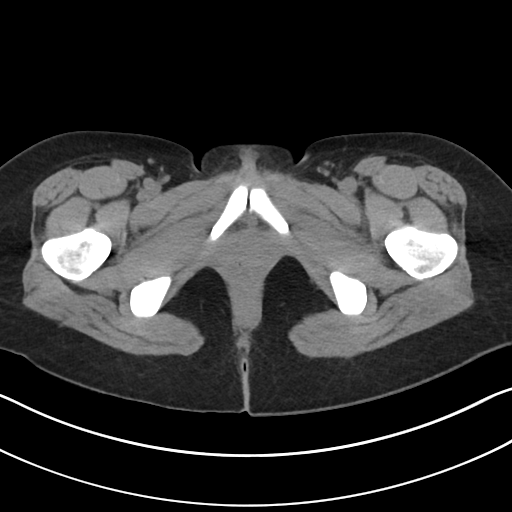
[im 7/91  bone]
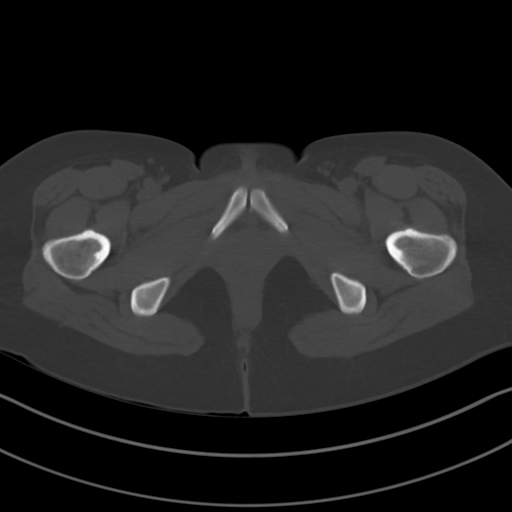
[im 14/91  soft-tissue]
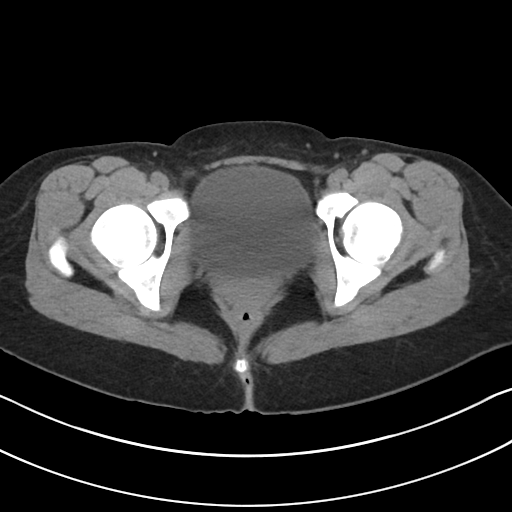
[im 21/91  soft-tissue]
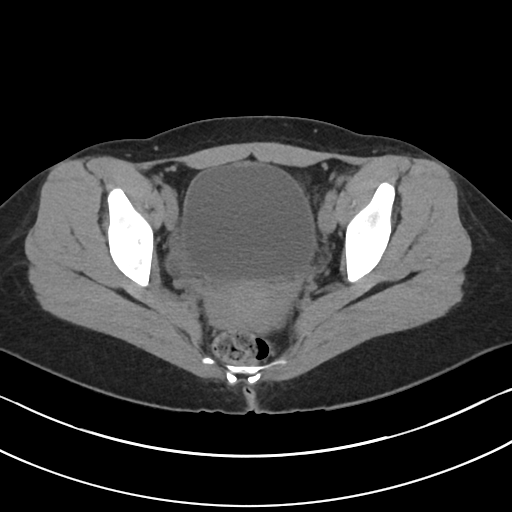
[im 28/91  soft-tissue]
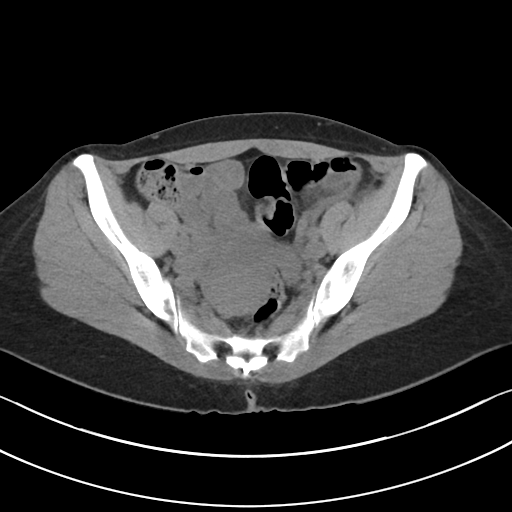
[im 35/91  soft-tissue]
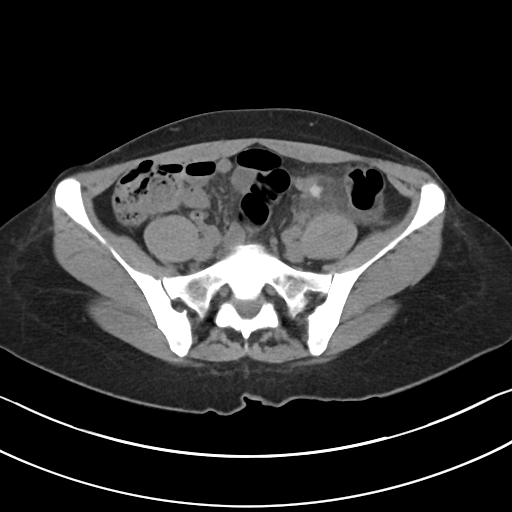
[im 42/91  soft-tissue]
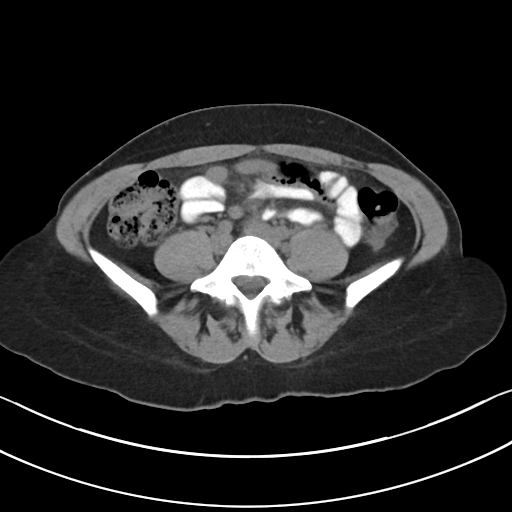
[im 49/91  soft-tissue]
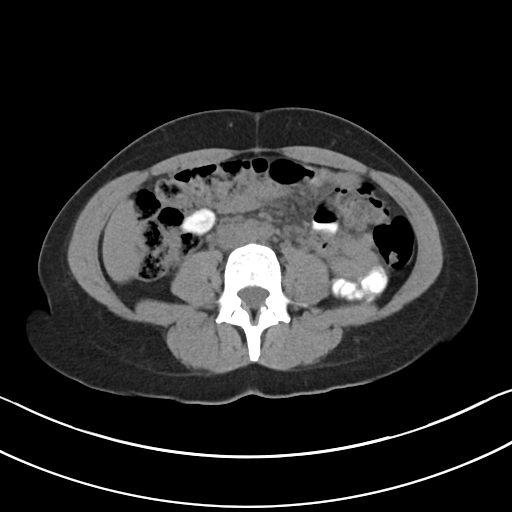
[im 56/91  soft-tissue]
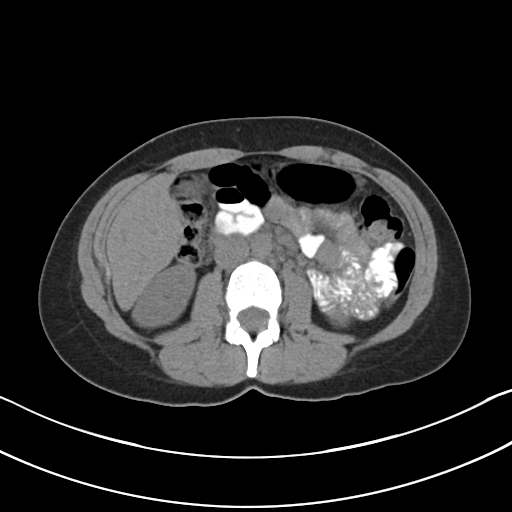
[im 63/91  soft-tissue]
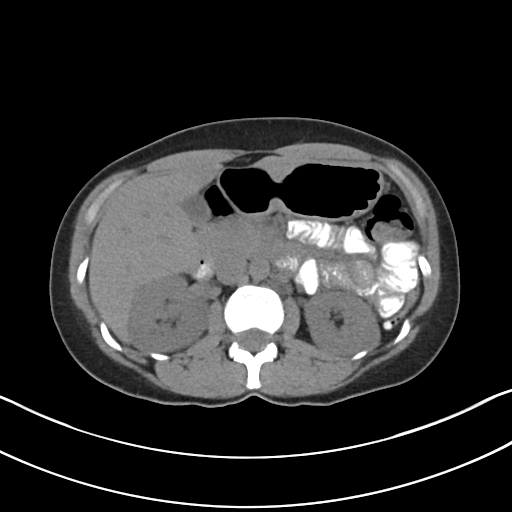
[im 63/91  bone]
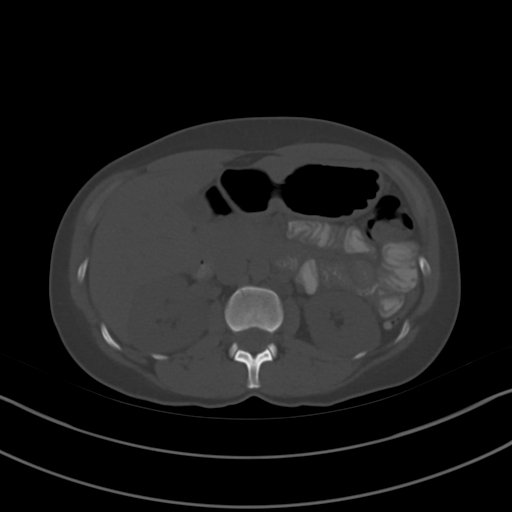
[im 70/91  soft-tissue]
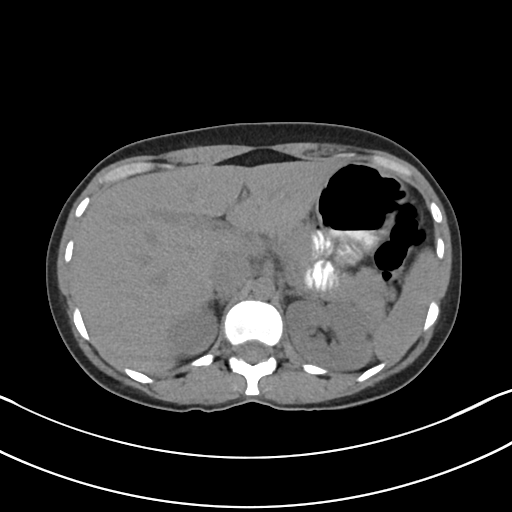
[im 77/91  soft-tissue]
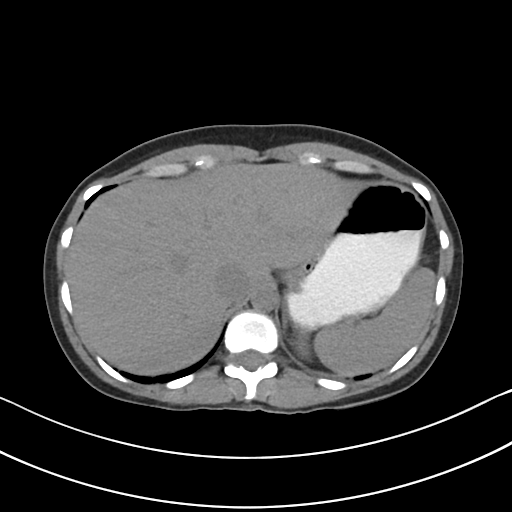
[im 84/91  soft-tissue]
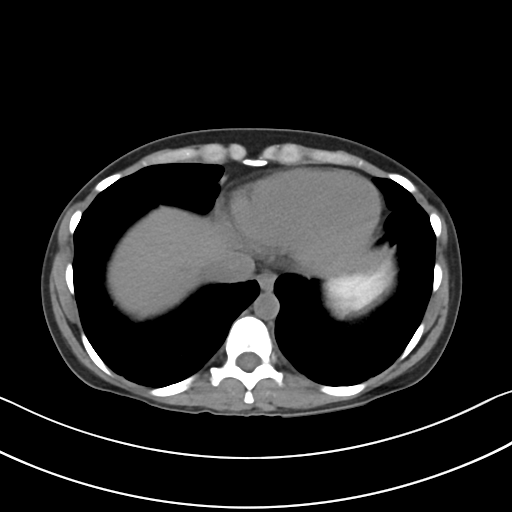

[Series 5: coronal · coronal · 0.73mm/px · 3 of 74 slices shown]
[im 25/74  soft-tissue]
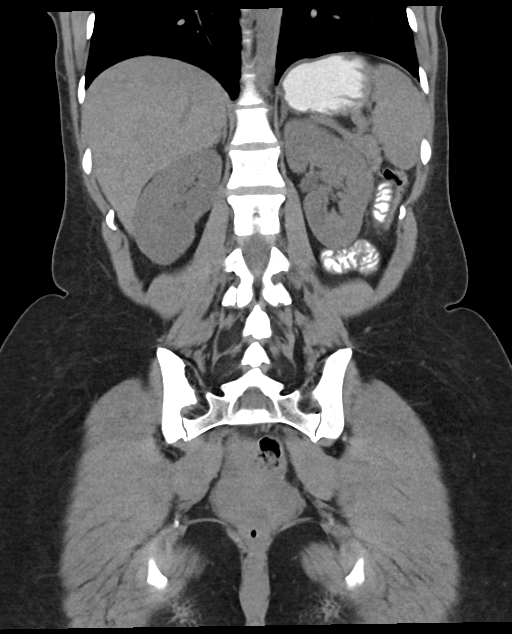
[im 33/74  soft-tissue]
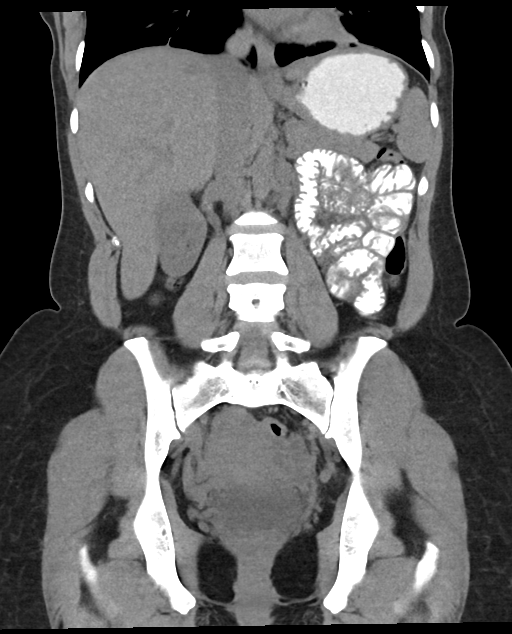
[im 41/74  soft-tissue]
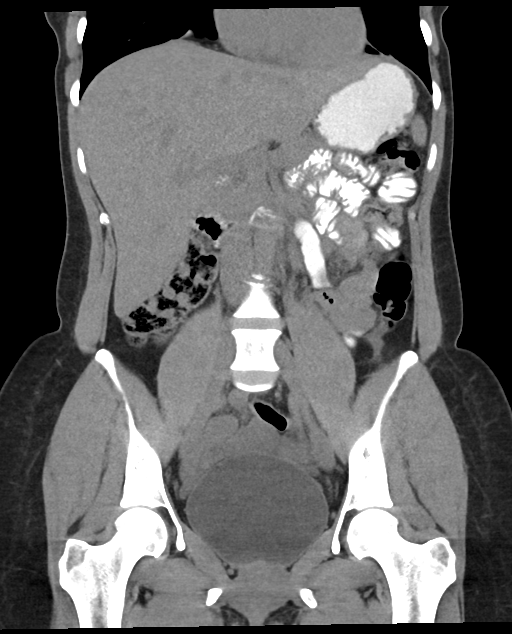

[15 of 46 positions shown; findings below may reference images not displayed]

FINDINGS: Lower chest: Unremarkable.

Hepatobiliary: No focal abnormality in the liver on this study
without intravenous contrast. There is no evidence for gallstones,
gallbladder wall thickening, or pericholecystic fluid. No
intrahepatic or extrahepatic biliary dilation.

Pancreas: No focal mass lesion. No dilatation of the main duct. No
intraparenchymal cyst. No peripancreatic edema.

Spleen: No splenomegaly. No focal mass lesion.

Adrenals/Urinary Tract: No adrenal nodule or mass. No stones in
either kidney or ureter. No secondary changes in either kidney or
ureter. Bladder unremarkable without evidence of stones.

Stomach/Bowel: Stomach is unremarkable. No gastric wall thickening.
No evidence of outlet obstruction. Duodenum is normally positioned
as is the ligament of Treitz. No small bowel wall thickening. No
small bowel dilatation. The terminal ileum is normal. The appendix
is normal. No gross colonic mass. Short segment circumferential wall
thickening noted in the proximal sigmoid colon with pericolonic
edema and inflammation. There is some mild diverticular disease in
this region and the edema/inflammation appears to be centered around
a prominent ill-defined diverticulum. The involved colon measures
approximately 4-5 cm in length and has relatively abrupt
transition/shouldering from normal to abnormal wall thickness (see
coronal image 57/5). There is no evidence for perforation or
abscess.

Vascular/Lymphatic: No abdominal aortic aneurysm. No abdominal
aortic atherosclerotic calcification. There is no gastrohepatic or
hepatoduodenal ligament lymphadenopathy. No retroperitoneal or
mesenteric lymphadenopathy. No pelvic sidewall lymphadenopathy.

Reproductive: The uterus is unremarkable.  There is no adnexal mass.

Other: No intraperitoneal free fluid.

Musculoskeletal: No worrisome lytic or sclerotic osseous
abnormality.
IMPRESSION: 1. Short segment circumferential wall thickening in the proximal
sigmoid colon with pericolonic edema and inflammation. There is some
mild diverticular disease in this region and the edema/inflammation
appears to be centered around a prominent ill-defined diverticulum.
Imaging features are most suggestive of diverticulitis without
perforation or abscess. The 4-5 cm of abnormal colon shows
relatively abrupt transition and "shouldering" to normal bowel wall
both proximally and distally and,m as such, GI follow-up suggested
to ensure that this resolves and to exclude underlying mass lesion.

## 2021-07-18 DIAGNOSIS — K219 Gastro-esophageal reflux disease without esophagitis: Secondary | ICD-10-CM | POA: Diagnosis not present

## 2021-07-18 DIAGNOSIS — J4540 Moderate persistent asthma, uncomplicated: Secondary | ICD-10-CM | POA: Diagnosis not present

## 2021-07-18 DIAGNOSIS — J4550 Severe persistent asthma, uncomplicated: Secondary | ICD-10-CM | POA: Diagnosis not present

## 2021-07-18 DIAGNOSIS — J019 Acute sinusitis, unspecified: Secondary | ICD-10-CM | POA: Diagnosis not present

## 2021-07-18 DIAGNOSIS — J3089 Other allergic rhinitis: Secondary | ICD-10-CM | POA: Diagnosis not present

## 2021-08-01 ENCOUNTER — Ambulatory Visit (INDEPENDENT_AMBULATORY_CARE_PROVIDER_SITE_OTHER): Payer: Federal, State, Local not specified - PPO | Admitting: Clinical

## 2021-08-01 DIAGNOSIS — F411 Generalized anxiety disorder: Secondary | ICD-10-CM | POA: Diagnosis not present

## 2021-08-01 NOTE — Progress Notes (Signed)
Diagnosis: Anxiety NOS, F41.9 Session time: 16:10RU-04:54UJ CPT code: [phone]  Rachel Ferrell was seen using secure video conferencing. She was in her home and the therapist was in her office at the time of the appointment. She shared that her mother had gotten engaged since her last session, which had brought up grief surrounding her father's death. Therapist provided an opportunity to process her grief, and encouraged her to consider how to honor her father's memory. She also shared recent developments in her work and home life, and expressed a desire to bring more structured goals into her daily routine, such as reading 10 pages per day. She is scheduled to be seen again in one month.   Objectives Related Problem: Rachel Ferrell experiences anxiety related to her son's well-being, as well as difficulty processing emotions relating to current situations and past traumas Description: Rachel Ferrell will develop strategies to help her regulate and process her emotions, including her anxiety and grief Target Date: 2021-12-07 Frequency: Biweekly Modality: individual Progress: 20% Planned Intervention: Therapist will help Rachel Ferrell to identify and disengage from negative thought patterns using CBT-based strategies  Planned Intervention: Therapist will provide Rachel Ferrell with referrals to additional resources as appropriate  Related Problem: Rachel Ferrell experiences anxiety related to her son's well-being, as well as difficulty processing emotions relating to current situations and past traumas Description: Rachel Ferrell will be provided with opportunities to process past traumatic experiences Target Date: 2021-12-07 Frequency: Daily Modality: individual Progress: 30% Planned Intervention: Therapist will provide Rachel Ferrell with opportunities to process her experiences in session   Treatment Plan Client Abilities/Strengths  Rachel Ferrell presented as insightful and motivated to improve her situation.  Client Treatment Preferences  Rachel Ferrell reported that she  has found therapy most helpful in the past when she worked with a therapist who confronted and challenged her.  Client Statement of Needs  Rachel Ferrell is seeking CBT to help her manage anxiety related to current stressors and past traumas.  Treatment Level  Biweekly  Symptoms  Anxiety: worried thoughts, difficulty relaxing, fears for the future, poor self-image, emerging social anxiety (Status: maintained). Depression: uncontrollable tearfulness, weight gain, emotional eating, depressed mood (Status: maintained).  Problems Addressed  New Description, New Description  Goals 1. Rachel Ferrell experiences anxiety related to her son's well-being, as well as difficulty processing emotions relating to current situations and past traumas Objective Rachel Ferrell will be provided with opportunities to process past traumatic experiences Target Date: 2021-12-07 Frequency: Daily  Progress: 30 Modality: individual  Related Interventions Therapist will help Naeema to process her relationships with family members in order to better understand how she may be unwittingly repeating negative patterns from her past Therapist will provide Rachel Ferrell with opportunities to process her experiences in session Objective Rachel Ferrell will develop strategies to help her regulate and process her emotions, including her anxiety and grief Target Date: 2021-12-07 Frequency: Biweekly  Progress: 20 Modality: individual  Related Interventions Therapist will help Rachel Ferrell to identify and assert appropriate boundaries in her relationships, as well as provide her with communication strategies to help her communicate her thoughts and feelings Therapist will provide Rachel Ferrell with referrals to additional resources as appropriate Therapist will provide Rachel Ferrell with strategies to help her regulate her emotions, including deep breathing, meditation, mindfulness, and self-care Therapist will help Rachel Ferrell to identify and disengage from negative thought patterns using CBT-based  strategies Therapist will engage Rachel Ferrell in behavior activation, including incorporation of pleasant and mastery events into her daily routine 2. Rachel Ferrell experiences grief and depressive symptoms related to her father's passing and her challenging relationship with  her mother Diagnosis Axis none 300.00 (Anxiety state, unspecified) - Open - [Signifier: n/a]    Conditions For Discharge Achievement of treatment goals and objectives        Rachel Cruise, PhD               Rachel Cruise, PhD

## 2021-08-29 ENCOUNTER — Ambulatory Visit (INDEPENDENT_AMBULATORY_CARE_PROVIDER_SITE_OTHER): Payer: Federal, State, Local not specified - PPO | Admitting: Clinical

## 2021-08-29 DIAGNOSIS — F331 Major depressive disorder, recurrent, moderate: Secondary | ICD-10-CM

## 2021-08-29 NOTE — Progress Notes (Signed)
Diagnosis: Anxiety NOS, F41.9 ?Session time: 10:00am-11:00am ?CPT code: [id number] ? ?Rachel Ferrell was seen using secure video conferencing. She was in her home and the therapist was in her office at the time of the appointment. She shared several developments that had taken place since her last session. Session focused on processing her fear of becoming like her mother. Therapist provided validation and support, encouraging her to consider whether there are issues in her present relationship with her mother contributing to this fear, as well as to notice the ways in which her own relationships with her children are different. She is scheduled to be seen again in one month. ? ?Objectives ?Related Problem: Rachel Ferrell experiences anxiety related to her son's well-being, as well as difficulty processing emotions relating to current situations and past traumas ?Description: Rachel Ferrell will develop strategies to help her regulate and process her emotions, including her anxiety and grief ?Target Date: 2021-12-07 ?Frequency: Biweekly ?Modality: individual ?Progress: 20% ?Planned Intervention: Therapist will help Rachel Ferrell to identify and disengage from negative thought patterns using CBT-based strategies  ?Planned Intervention: Therapist will provide Rachel Ferrell with referrals to additional resources as appropriate  ?Related Problem: Rachel Ferrell experiences anxiety related to her son's well-being, as well as difficulty processing emotions relating to current situations and past traumas ?Description: Rachel Ferrell will be provided with opportunities to process past traumatic experiences ?Target Date: 2021-12-07 ?Frequency: Daily ?Modality: individual ?Progress: 30% ?Planned Intervention: Therapist will provide Rachel Ferrell with opportunities to process her experiences in session  ? ?Treatment Plan ?Client Abilities/Strengths  ?Rachel Ferrell presented as insightful and motivated to improve her situation.  ?Client Treatment Preferences  ?Rachel Ferrell reported that she has found therapy  most helpful in the past when she worked with a therapist who confronted and challenged her.  ?Client Statement of Needs  ?Rachel Ferrell is seeking CBT to help her manage anxiety related to current stressors and past traumas.  ?Treatment Level  ?Biweekly  ?Symptoms  ?Anxiety: worried thoughts, difficulty relaxing, fears for the future, poor self-image, emerging social anxiety (Status: maintained). Depression: uncontrollable tearfulness, weight gain, emotional eating, depressed mood (Status: maintained).  ?Problems Addressed  ?New Description, New Description  ?Goals ?1. Rachel Ferrell experiences anxiety related to her son's well-being, as well as difficulty processing emotions relating to current situations and past traumas ?Objective ?Rachel Ferrell will be provided with opportunities to process past traumatic experiences ?Target Date: 2021-12-07 Frequency: Daily  ?Progress: 30 Modality: individual  ?Related Interventions ?Therapist will help Rachel Ferrell to process her relationships with family members in order to better understand how she may be unwittingly repeating negative patterns from her past ?Therapist will provide Rachel Ferrell with opportunities to process her experiences in session ?Objective ?Rachel Ferrell will develop strategies to help her regulate and process her emotions, including her anxiety and grief ?Target Date: 2021-12-07 Frequency: Biweekly  ?Progress: 20 Modality: individual  ?Related Interventions ?Therapist will help Rachel Ferrell to identify and assert appropriate boundaries in her relationships, as well as provide her with communication strategies to help her communicate her thoughts and feelings ?Therapist will provide Rachel Ferrell with referrals to additional resources as appropriate ?Therapist will provide Rachel Ferrell with strategies to help her regulate her emotions, including deep breathing, meditation, mindfulness, and self-care ?Therapist will help Rachel Ferrell to identify and disengage from negative thought patterns using CBT-based strategies ?Therapist  will engage Rachel Ferrell in behavior activation, including incorporation of pleasant and mastery events into her daily routine ?2. Rachel Ferrell experiences grief and depressive symptoms related to her father's passing and her challenging relationship with her mother ?Diagnosis ?Axis none 300.00 (Anxiety state,  unspecified) - Open - [Signifier: n/a]    ?Conditions For Discharge ?Achievement of treatment goals and objectives ? ? ? ? ? ? ? ?Rachel Cruise, PhD ? ? ? ? ? ? ? ? ? ? ? ? ? ? ?Rachel Cruise, PhD ? ? ? ? ? ? ? ? ? ? ? ? ? ? ?Rachel Cruise, PhD ?

## 2021-09-22 DIAGNOSIS — J029 Acute pharyngitis, unspecified: Secondary | ICD-10-CM | POA: Diagnosis not present

## 2021-09-22 DIAGNOSIS — J019 Acute sinusitis, unspecified: Secondary | ICD-10-CM | POA: Diagnosis not present

## 2021-09-27 ENCOUNTER — Ambulatory Visit (INDEPENDENT_AMBULATORY_CARE_PROVIDER_SITE_OTHER): Payer: Federal, State, Local not specified - PPO | Admitting: Clinical

## 2021-09-27 DIAGNOSIS — F411 Generalized anxiety disorder: Secondary | ICD-10-CM

## 2021-09-27 NOTE — Progress Notes (Signed)
Diagnosis: Anxiety NOS, F41.9 ?Session time: 10:00am-11:00am ?CPT code: [id number] ? ?Rachel Ferrell was seen using secure video conferencing. She was in her home and the therapist was in her office at the time of the appointment. She shared that she has been especially concerned about her husband's health and overall stress level since her last session. Therapist processed this with her, and worked with her to consider what is under her influence, versus what is not. She created a plan to scheduled a meditation class for her husband (he has been receptive to this in the past and expressed interest in a meditation class). She is scheduled to be seen again in one month. ? ?Objectives ?Related Problem: Rachel Ferrell experiences anxiety related to her son's well-being, as well as difficulty processing emotions relating to current situations and past traumas ?Description: Rachel Ferrell will develop strategies to help her regulate and process her emotions, including her anxiety and grief ?Target Date: 2021-12-07 ?Frequency: Biweekly ?Modality: individual ?Progress: 20% ?Planned Intervention: Therapist will help Rachel Ferrell to identify and disengage from negative thought patterns using CBT-based strategies  ?Planned Intervention: Therapist will provide Rachel Ferrell with referrals to additional resources as appropriate  ?Related Problem: Rachel Ferrell experiences anxiety related to her son's well-being, as well as difficulty processing emotions relating to current situations and past traumas ?Description: Rachel Ferrell will be provided with opportunities to process past traumatic experiences ?Target Date: 2021-12-07 ?Frequency: Daily ?Modality: individual ?Progress: 30% ?Planned Intervention: Therapist will provide Rachel Ferrell with opportunities to process her experiences in session  ? ?Treatment Plan ?Client Abilities/Strengths  ?Rachel Ferrell presented as insightful and motivated to improve her situation.  ?Client Treatment Preferences  ?Rachel Ferrell reported that she has found therapy most  helpful in the past when she worked with a therapist who confronted and challenged her.  ?Client Statement of Needs  ?Rachel Ferrell is seeking CBT to help her manage anxiety related to current stressors and past traumas.  ?Treatment Level  ?Biweekly  ?Symptoms  ?Anxiety: worried thoughts, difficulty relaxing, fears for the future, poor self-image, emerging social anxiety (Status: maintained). Depression: uncontrollable tearfulness, weight gain, emotional eating, depressed mood (Status: maintained).  ?Problems Addressed  ?New Description, New Description  ?Goals ?1. Rachel Ferrell experiences anxiety related to her son's well-being, as well as difficulty processing emotions relating to current situations and past traumas ?Objective ?Rachel Ferrell will be provided with opportunities to process past traumatic experiences ?Target Date: 2021-12-07 Frequency: Daily  ?Progress: 30 Modality: individual  ?Related Interventions ?Therapist will help Calani to process her relationships with family members in order to better understand how she may be unwittingly repeating negative patterns from her past ?Therapist will provide Rachel Ferrell with opportunities to process her experiences in session ?Objective ?Rachel Ferrell will develop strategies to help her regulate and process her emotions, including her anxiety and grief ?Target Date: 2021-12-07 Frequency: Biweekly  ?Progress: 20 Modality: individual  ?Related Interventions ?Therapist will help Rachel Ferrell to identify and assert appropriate boundaries in her relationships, as well as provide her with communication strategies to help her communicate her thoughts and feelings ?Therapist will provide Rachel Ferrell with referrals to additional resources as appropriate ?Therapist will provide Rachel Ferrell with strategies to help her regulate her emotions, including deep breathing, meditation, mindfulness, and self-care ?Therapist will help Rachel Ferrell to identify and disengage from negative thought patterns using CBT-based strategies ?Therapist will  engage Rachel Ferrell in behavior activation, including incorporation of pleasant and mastery events into her daily routine ?2. Rachel Ferrell experiences grief and depressive symptoms related to her father's passing and her challenging relationship with her mother ?Diagnosis ?Axis  none 300.00 (Anxiety state, unspecified) - Open - [Signifier: n/a]    ?Conditions For Discharge ?Achievement of treatment goals and objectives ? ? ? ? ? ? ? ?[name], PhD ? ? ? ? ? ? ? ? ? ? ? ? ? ? ?[name], PhD ? ? ? ? ? ? ? ? ? ? ? ? ? ? ?[name], PhD ? ? ? ? ? ? ? ? ? ? ? ? ? ? ?[name], PhD ?

## 2021-10-31 ENCOUNTER — Ambulatory Visit (INDEPENDENT_AMBULATORY_CARE_PROVIDER_SITE_OTHER): Payer: Federal, State, Local not specified - PPO | Admitting: Clinical

## 2021-10-31 DIAGNOSIS — F411 Generalized anxiety disorder: Secondary | ICD-10-CM | POA: Diagnosis not present

## 2021-11-06 NOTE — Progress Notes (Signed)
Diagnosis: Anxiety NOS, F41.9 Session time: 10:00am-11:00am CPT code: [phone]  Rachel Ferrell was seen using secure video conferencing. She was in her home and the therapist was in her office at the time of the appointment. Session focused on processing recent events with her family. She also reflected upon a growing sense of overwhelm and difficulty completing tasks on her to-do list. Therapist suggested creating a short list (no more than 3 items) and setting a timer for a short period in which to complete them. Therapist also provided referral options for an evaluation. She is scheduled to be seen again in one month.  Objectives Related Problem: Rachel Ferrell experiences anxiety related to her son's well-being, as well as difficulty processing emotions relating to current situations and past traumas Description: Rachel Ferrell will develop strategies to help her regulate and process her emotions, including her anxiety and grief Target Date: 2021-12-07 Frequency: Biweekly Modality: individual Progress: 20% Planned Intervention: Therapist will help Rachel Ferrell to identify and disengage from negative thought patterns using CBT-based strategies  Planned Intervention: Therapist will provide Rachel Ferrell with referrals to additional resources as appropriate  Related Problem: Rachel Ferrell experiences anxiety related to her son's well-being, as well as difficulty processing emotions relating to current situations and past traumas Description: Rachel Ferrell will be provided with opportunities to process past traumatic experiences Target Date: 2021-12-07 Frequency: Daily Modality: individual Progress: 30% Planned Intervention: Therapist will provide Rachel Ferrell with opportunities to process her experiences in session   Treatment Plan Client Abilities/Strengths  Rachel Ferrell presented as insightful and motivated to improve her situation.  Client Treatment Preferences  Rachel Ferrell reported that she has found therapy most helpful in the past when she worked with a  therapist who confronted and challenged her.  Client Statement of Needs  Rachel Ferrell is seeking CBT to help her manage anxiety related to current stressors and past traumas.  Treatment Level  Biweekly  Symptoms  Anxiety: worried thoughts, difficulty relaxing, fears for the future, poor self-image, emerging social anxiety (Status: maintained). Depression: uncontrollable tearfulness, weight gain, emotional eating, depressed mood (Status: maintained).  Problems Addressed  New Description, New Description  Goals 1. Rachel Ferrell experiences anxiety related to her son's well-being, as well as difficulty processing emotions relating to current situations and past traumas Objective Rachel Ferrell will be provided with opportunities to process past traumatic experiences Target Date: 2021-12-07 Frequency: Daily  Progress: 30 Modality: individual  Related Interventions Therapist will help Khadejah to process her relationships with family members in order to better understand how she may be unwittingly repeating negative patterns from her past Therapist will provide Rachel Ferrell with opportunities to process her experiences in session Objective Rachel Ferrell will develop strategies to help her regulate and process her emotions, including her anxiety and grief Target Date: 2021-12-07 Frequency: Biweekly  Progress: 20 Modality: individual  Related Interventions Therapist will help Rachel Ferrell to identify and assert appropriate boundaries in her relationships, as well as provide her with communication strategies to help her communicate her thoughts and feelings Therapist will provide Rachel Ferrell with referrals to additional resources as appropriate Therapist will provide Rachel Ferrell with strategies to help her regulate her emotions, including deep breathing, meditation, mindfulness, and self-care Therapist will help Rachel Ferrell to identify and disengage from negative thought patterns using CBT-based strategies Therapist will engage Rachel Ferrell in behavior activation,  including incorporation of pleasant and mastery events into her daily routine 2. Rachel Ferrell experiences grief and depressive symptoms related to her father's passing and her challenging relationship with her mother Diagnosis Axis none 300.00 (Anxiety state, unspecified) - Open - [Signifier: n/a]  Conditions For Discharge Achievement of treatment goals and objectives        [name], PhD               [name], PhD

## 2021-12-02 ENCOUNTER — Ambulatory Visit (INDEPENDENT_AMBULATORY_CARE_PROVIDER_SITE_OTHER): Payer: Federal, State, Local not specified - PPO | Admitting: Clinical

## 2021-12-02 DIAGNOSIS — F411 Generalized anxiety disorder: Secondary | ICD-10-CM | POA: Diagnosis not present

## 2021-12-02 NOTE — Progress Notes (Signed)
Diagnosis: Ferrell NOS, F41.9 Session time: 2:05pm-3:00pm CPT code: [phone]  Rachel Ferrell was seen using secure video conferencing. She was in her home and the therapist was in her office at the time of the appointment. Session focused on issues that had arisen in her relationship with her oldest son. Therapist suggested communication strategies and recommended resources such as the book Mindset by Claiborne Rigg, as well as materials on motivational interviewing. She is scheduled to be seen again in three weeks.  Objectives Related Problem: Rachel Ferrell related to her son's well-being, as well as difficulty processing emotions relating to current situations and past traumas Description: Rachel Ferrell will develop strategies to help her regulate and process her emotions, including her Ferrell and grief Target Date: 2021-12-07 Frequency: Biweekly Modality: individual Progress: 20% Planned Intervention: Therapist will help Rachel Ferrell to identify and disengage from negative thought patterns using CBT-based strategies  Planned Intervention: Therapist will provide Rachel Ferrell with referrals to additional resources as appropriate  Related Problem: Rachel Ferrell experiences Ferrell related to her son's well-being, as well as difficulty processing emotions relating to current situations and past traumas Description: Rachel Ferrell with opportunities to process past traumatic experiences Target Date: 2021-12-07 Frequency: Daily Modality: individual Progress: 30% Planned Intervention: Therapist will provide Rachel Ferrell with opportunities to process her experiences in session   Treatment Plan Client Abilities/Strengths  Rachel Ferrell presented as insightful and motivated to improve her situation.  Client Treatment Preferences  Rachel Ferrell reported that she has found therapy most helpful in the past when she worked with a therapist who confronted and challenged her.  Client Statement of Needs  Rachel Ferrell is seeking CBT to help her manage  Ferrell related to current stressors and past traumas.  Treatment Level  Biweekly  Symptoms  Ferrell: worried thoughts, difficulty relaxing, fears for the future, poor self-image, emerging social Ferrell (Status: maintained). Depression: uncontrollable tearfulness, weight gain, emotional eating, depressed mood (Status: maintained).  Problems Addressed  New Description, New Description  Goals 1. Rachel Ferrell experiences Ferrell related to her son's well-being, as well as difficulty processing emotions relating to current situations and past traumas Objective Rachel Ferrell will be Ferrell with opportunities to process past traumatic experiences Target Date: 2021-12-07 Frequency: Daily  Progress: 30 Modality: individual  Related Interventions Therapist will help Rachel Ferrell to process her relationships with family members in order to better understand how she may be unwittingly repeating negative patterns from her past Therapist will provide Rachel Ferrell with opportunities to process her experiences in session Objective Rachel Ferrell will develop strategies to help her regulate and process her emotions, including her Ferrell and grief Target Date: 2021-12-07 Frequency: Biweekly  Progress: 20 Modality: individual  Related Interventions Therapist will help Rachel Ferrell to identify and assert appropriate boundaries in her relationships, as well as provide her with communication strategies to help her communicate her thoughts and feelings Therapist will provide Rachel Ferrell with referrals to additional resources as appropriate Therapist will provide Rachel Ferrell with strategies to help her regulate her emotions, including deep breathing, meditation, mindfulness, and self-care Therapist will help Rachel Ferrell to identify and disengage from negative thought patterns using CBT-based strategies Therapist will engage Rachel Ferrell in behavior activation, including incorporation of pleasant and mastery events into her daily routine 2. Rachel Ferrell experiences grief and depressive  symptoms related to her father's passing and her challenging relationship with her mother Diagnosis Axis none 300.00 (Ferrell state, unspecified) - Open - [Signifier: n/a]    Conditions For Discharge Achievement of treatment goals and objectives        Rachel Noa,  PhD               Rachel Noa, PhD

## 2021-12-11 DIAGNOSIS — Z6824 Body mass index (BMI) 24.0-24.9, adult: Secondary | ICD-10-CM | POA: Diagnosis not present

## 2021-12-11 DIAGNOSIS — N930 Postcoital and contact bleeding: Secondary | ICD-10-CM | POA: Diagnosis not present

## 2021-12-11 DIAGNOSIS — Z1231 Encounter for screening mammogram for malignant neoplasm of breast: Secondary | ICD-10-CM | POA: Diagnosis not present

## 2021-12-11 DIAGNOSIS — Z01419 Encounter for gynecological examination (general) (routine) without abnormal findings: Secondary | ICD-10-CM | POA: Diagnosis not present

## 2021-12-27 ENCOUNTER — Ambulatory Visit: Payer: Federal, State, Local not specified - PPO | Admitting: Clinical

## 2021-12-27 DIAGNOSIS — F411 Generalized anxiety disorder: Secondary | ICD-10-CM | POA: Diagnosis not present

## 2021-12-27 NOTE — Progress Notes (Signed)
Diagnosis: Anxiety NOS, F41.9 Session time: 12:00pm-1:00pm CPT code: [phone]  Rachel Ferrell was seen using secure video conferencing. She was in her home and the therapist was in her office at the time of the appointment. She reflected upon several recent developments, including weight gain and an unhealthy relationship with food. Therapist encouraged her to reach out to her PCP and GI specialist to understand any medical factors contributing to recent weight gain. Therapist also suggested calling an eating disorders specialist to work through her relationship with food, and offered to send a list of referrals. She is scheduled to be seen again in 6 weeks, and therapist will reach out if cancellations happen.  Objectives Related Problem: Rachel Ferrell experiences anxiety related to her son's well-being, as well as difficulty processing emotions relating to current situations and past traumas Description: Rachel Ferrell will develop strategies to help her regulate and process her emotions, including her anxiety and grief Target Date: 2022-12-08 Frequency: Biweekly Modality: individual Progress: 20% Planned Intervention: Therapist will help Rachel Ferrell to identify and disengage from negative thought patterns using CBT-based strategies  Planned Intervention: Therapist will provide Rachel Ferrell with referrals to additional resources as appropriate  Related Problem: Rachel Ferrell experiences anxiety related to her son's well-being, as well as difficulty processing emotions relating to current situations and past traumas Description: Rachel Ferrell will be provided with opportunities to process past traumatic experiences Target Date: 2022-12-08 Frequency: Daily Modality: individual Progress: 30% Planned Intervention: Therapist will provide Rachel Ferrell with opportunities to process her experiences in session   Treatment Plan Client Abilities/Strengths  Rachel Ferrell presented as insightful and motivated to improve her situation.  Client Treatment Preferences   Rachel Ferrell reported that she has found therapy most helpful in the past when she worked with a therapist who confronted and challenged her.  Client Statement of Needs  Rachel Ferrell is seeking CBT to help her manage anxiety related to current stressors and past traumas.  Treatment Level  Biweekly  Symptoms  Anxiety: worried thoughts, difficulty relaxing, fears for the future, poor self-image, emerging social anxiety (Status: maintained). Depression: uncontrollable tearfulness, weight gain, emotional eating, depressed mood (Status: maintained).  Problems Addressed  New Description, New Description  Goals 1. Rachel Ferrell experiences anxiety related to her son's well-being, as well as difficulty processing emotions relating to current situations and past traumas Objective Rachel Ferrell will be provided with opportunities to process past traumatic experiences Target Date: 2022-12-08 Frequency: Daily  Progress: 30 Modality: individual  Related Interventions Therapist will help Rachel Ferrell to process her relationships with family members in order to better understand how she may be unwittingly repeating negative patterns from her past Therapist will provide Rachel Ferrell with opportunities to process her experiences in session Objective Rachel Ferrell will develop strategies to help her regulate and process her emotions, including her anxiety and grief Target Date: 2022-12-08 Frequency: Biweekly  Progress: 20 Modality: individual  Related Interventions Therapist will help Rachel Ferrell to identify and assert appropriate boundaries in her relationships, as well as provide her with communication strategies to help her communicate her thoughts and feelings Therapist will provide Rachel Ferrell with referrals to additional resources as appropriate Therapist will provide Rachel Ferrell with strategies to help her regulate her emotions, including deep breathing, meditation, mindfulness, and self-care Therapist will help Rachel Ferrell to identify and disengage from negative thought  patterns using CBT-based strategies Therapist will engage Rachel Ferrell in behavior activation, including incorporation of pleasant and mastery events into her daily routine 2. Rachel Ferrell experiences grief and depressive symptoms related to her father's passing and her challenging relationship with her mother Diagnosis Axis  none 300.00 (Anxiety state, unspecified) - Open - [Signifier: n/a]    Conditions For Discharge Achievement of treatment goals and objectives        Rachel Cruise, PhD               Rachel Cruise, PhD

## 2022-02-12 ENCOUNTER — Ambulatory Visit: Payer: Federal, State, Local not specified - PPO | Admitting: Gastroenterology

## 2022-02-12 ENCOUNTER — Encounter: Payer: Self-pay | Admitting: Gastroenterology

## 2022-02-12 ENCOUNTER — Other Ambulatory Visit (INDEPENDENT_AMBULATORY_CARE_PROVIDER_SITE_OTHER): Payer: Federal, State, Local not specified - PPO

## 2022-02-12 VITALS — BP 112/80 | HR 77 | Ht 66.0 in | Wt 164.0 lb

## 2022-02-12 DIAGNOSIS — Z8719 Personal history of other diseases of the digestive system: Secondary | ICD-10-CM | POA: Diagnosis not present

## 2022-02-12 DIAGNOSIS — R140 Abdominal distension (gaseous): Secondary | ICD-10-CM

## 2022-02-12 LAB — TSH: TSH: 1.98 u[IU]/mL (ref 0.35–5.50)

## 2022-02-12 MED ORDER — DICYCLOMINE HCL 20 MG PO TABS: 20.0000 mg | ORAL_TABLET | Freq: Four times a day (QID) | ORAL | 1 refills | Status: DC

## 2022-02-12 NOTE — Progress Notes (Signed)
HPI : Rachel Ferrell is a very pleasant 41 year old female with a history of asthma and uncomplicated diverticulitis who I initially saw in July 2022 following her diverticulitis episode in May 2022.  Her symptoms had resolved and she underwent a colonoscopy in September 2022 which showed a 10 mm sessile serrated adenoma in the ascending colon, a few small hyperplastic polyps and left sided diverticulosis.  She was recommended to repeat colonoscopy in 3 years. She reports that she had no problems with any GI symptoms until the last 6 or 7 weeks.  Since then, she has been bothered by excessive bloating and abdominal distention, as well as abdominal pain or discomfort.  She frequently feels like she needs to go the bathroom, but is unable to produce stool.  She frequently has "explosive" stools.  Stool consistency varies, but is often loose or small caliber.  No blood in the stool.  No nocturnal bowel movements.  She has a bowel movement usually once a day or every other day. Her bloating and distention are more noticeable between dinner and bedtime, sometimes the symptoms interfere with her sleep.  She denies any weight loss; rather, she has gained 20 pounds in the past few months. She takes Gas-X frequently, but is not sure that it helps much. She denies any changes in her diet preceding or B symptoms.  No changes in medications.  She denies any recent antibiotics.  No recent acute GI illnesses. She does admit to dealing with some significant stress recently.    Past Medical History:  Diagnosis Date   Asthma    Diverticulosis    GERD (gastroesophageal reflux disease)    Colonoscopy September 2022: 10 mm sessile serrated adenoma, multiple small left-sided hyperplastic polyps, left-sided diverticulosis: Repeat colonoscopy 3 years  Past Surgical History:  Procedure Laterality Date   SINUS IRRIGATION     Family History  Problem Relation Age of Onset   Hypertension Other    Cancer - Colon Neg Hx     Stomach cancer Neg Hx    Esophageal cancer Neg Hx    Pancreatic cancer Neg Hx    Colon cancer Neg Hx    Rectal cancer Neg Hx    Social History   Tobacco Use   Smoking status: Never   Smokeless tobacco: Never  Vaping Use   Vaping Use: Never used  Substance Use Topics   Alcohol use: No   Drug use: No   Current Outpatient Medications  Medication Sig Dispense Refill   albuterol (PROVENTIL HFA;VENTOLIN HFA) 108 (90 BASE) MCG/ACT inhaler Inhale 2 puffs into the lungs every 6 (six) hours as needed for wheezing.     Cholecalciferol (VITAMIN D) 2000 units tablet Take 2,000 Units by mouth See admin instructions. Take 1 tablet (2000 units) by mouth daily during winter months and 1 tablet (2000 units) every other day during summer months     fluticasone (FLONASE) 50 MCG/ACT nasal spray Place 1 spray into both nostrils at bedtime.      fluticasone furoate-vilanterol (BREO ELLIPTA) 200-25 MCG/INH AEPB Inhale 1 puff into the lungs daily.     montelukast (SINGULAIR) 10 MG tablet Take 10 mg by mouth at bedtime.     Multiple Vitamin (MULTIVITAMIN WITH MINERALS) TABS tablet Take 2 tablets by mouth 2 (two) times daily. [employer] brand     Tiotropium Bromide Monohydrate (SPIRIVA RESPIMAT) 1.25 MCG/ACT AERS Take 1.25 mg by mouth daily. Inhale 2 puffs by mouth daily     amoxicillin-clavulanate (  AUGMENTIN) 875-125 MG tablet Take 1 tablet by mouth 2 (two) times daily. (Patient not taking: Reported on 02/12/2022) 10 tablet 0   dicyclomine (BENTYL) 20 MG tablet Take 1 by mouth every 6 hours as needed for abdominal discomfort (Patient not taking: Reported on 02/12/2022) 30 tablet 0   No current facility-administered medications for this visit.   No Known Allergies   Review of Systems: All systems reviewed and negative except where noted in HPI.    No results found.  Physical Exam: BP 112/80 (BP Location: Right Arm, Patient Position: Sitting, Cuff Size: Normal)   Pulse 77   Ht '5\' 6"'$  (1.676 m)   Wt  164 lb (74.4 kg)   SpO2 97%   BMI 26.47 kg/m  Constitutional: Pleasant,well-developed, Caucasian female in no acute distress. HEENT: Normocephalic and atraumatic. Conjunctivae are normal. No scleral icterus. Neck supple.  Cardiovascular: Normal rate, regular rhythm.  Pulmonary/chest: Effort normal and breath sounds normal. No wheezing, rales or rhonchi. Abdominal: Soft, nondistended, mild tenderness to palpation in left lower quadrant and suprapubic region.  No rigidity or guarding.  Bowel sounds active throughout. There are no masses palpable. No hepatomegaly. Extremities: no edema Neurological: Alert and oriented to person place and time. Skin: Skin is warm and dry. No rashes noted. Psychiatric: Normal mood and affect. Behavior is normal.  CBC    Component Value Date/Time   WBC 13.0 (H) 11/06/2020 1315   RBC 4.29 11/06/2020 1315   HGB 12.8 11/06/2020 1315   HCT 39.1 11/06/2020 1315   PLT 246 11/06/2020 1315   MCV 91.1 11/06/2020 1315   MCH 29.8 11/06/2020 1315   MCHC 32.7 11/06/2020 1315   RDW 14.3 11/06/2020 1315    CMP     Component Value Date/Time   NA 138 11/06/2020 1315   K 3.6 11/06/2020 1315   CL 103 11/06/2020 1315   CO2 27 11/06/2020 1315   GLUCOSE 90 11/06/2020 1315   BUN 10 11/06/2020 1315   CREATININE 0.57 11/06/2020 1315   CALCIUM 9.0 11/06/2020 1315   PROT 6.4 (L) 11/06/2020 1315   ALBUMIN 4.0 11/06/2020 1315   AST 12 (L) 11/06/2020 1315   ALT 11 11/06/2020 1315   ALKPHOS 53 11/06/2020 1315   BILITOT 1.1 11/06/2020 1315   GFRNONAA >60 11/06/2020 1315   GFRAA >60 12/28/2015 1045     ASSESSMENT AND PLAN: 41 year old female with history of uncomplicated left-sided diverticulitis in 2022, with recent development of persistent abdominal bloating and distention.  No red flag symptoms.  No recent changes in diet or medications.  No recent antibiotics or acute GI illnesses.  Patient symptoms may be stress related.  We will check for H. pylori, celiac  serologies and thyroid levels.  Recommended patient try Bentyl and/or IBgard to help with symptoms.  Suggested that she limit intake of foods known to worsen gas production such as cruciferous vegetables and legumes.  Abdominal bloating/distention -H pylori stool antigen -TTG/IgA -TSH -Bentyl +/-IBGard - Dietary modifications as above  F/u 6-8 weeks as needed  Cabot Cromartie E. Candis Schatz, MD [facility], [place] *

## 2022-02-12 NOTE — Patient Instructions (Signed)
Please head down to the basement for labs today.   We have sent Bentyl to your pharmacy.  We have given you samples of IBgard to take as directed.    Follow up in 6-8 weeks as needed.

## 2022-02-13 DIAGNOSIS — R140 Abdominal distension (gaseous): Secondary | ICD-10-CM | POA: Diagnosis not present

## 2022-02-13 LAB — IGA: Immunoglobulin A: 136 mg/dL (ref 47–310)

## 2022-02-13 LAB — TISSUE TRANSGLUTAMINASE, IGA: (tTG) Ab, IgA: <1 U/mL

## 2022-02-14 LAB — HELICOBACTER PYLORI  SPECIAL ANTIGEN
MICRO NUMBER:: 13885549
SPECIMEN QUALITY: ADEQUATE

## 2022-02-16 NOTE — Progress Notes (Signed)
Rachel Ferrell,  Tests for celiac disease, H. Pylori infection and thyroid disorders were all normal.  Symptoms may be stress-related.  Please continue the medications as needed and avoid the gas-forming foods as discussed.  Follow up in 6-8 weeks as needed for ongoing symptoms.

## 2022-02-19 ENCOUNTER — Ambulatory Visit (INDEPENDENT_AMBULATORY_CARE_PROVIDER_SITE_OTHER): Payer: Federal, State, Local not specified - PPO | Admitting: Psychology

## 2022-02-19 DIAGNOSIS — F89 Unspecified disorder of psychological development: Secondary | ICD-10-CM

## 2022-02-19 NOTE — Progress Notes (Addendum)
Date: 02/19/2022 Appointment Start Time: 9am Duration: 115 minutes Provider: Clarice Pole, PsyD Type of Session: Initial Appointment for Evaluation  Location of Patient: Home Location of Provider: Provider's Home (private office) Type of Contact: WebEx video visit with audio  Session Content:  Prior to proceeding with today's appointment, two pieces of identifying information were obtained from Rachel Ferrell to verify identity. In addition, Rachel Ferrell's physical location at the time of this appointment was obtained. In the event of technical difficulties, Rachel Ferrell shared a phone number she could be reached at. Rachel Ferrell and this provider participated in today's telepsychological service. Rachel Ferrell denied anyone else being present in the room or on the virtual appointment.  The provider's role was explained to Rachel Ferrell. The provider reviewed and discussed issues of confidentiality, privacy, and limits therein (e.g., reporting obligations). In addition to verbal informed consent, written informed consent for psychological services was obtained from Rachel Ferrell prior to the initial appointment. Written consent included information concerning the practice, financial arrangements, and confidentiality and patients' rights. Since the clinic is not a 24/7 crisis center, mental health emergency resources were shared, and the provider explained e-mail, voicemail, and/or other messaging systems should be utilized only for non-emergency reasons. This provider also explained that information obtained during appointments will be placed in their electronic medical record in a confidential manner. Rachel Ferrell verbally acknowledged understanding of the aforementioned and agreed to use mental health emergency resources discussed if needed. Moreover, Rachel Ferrell agreed information may be shared with other Rachel Ferrell or their referring provider(s) as needed for coordination of care. By signing the new patient documents, Rachel Ferrell provided written consent for coordination  of care. Rachel Ferrell verbally acknowledged understanding she is ultimately responsible for understanding her insurance benefits as it relates to reimbursement of telepsychological and in-person services. This provider also reviewed confidentiality, as it relates to telepsychological services, as well as the rationale for telepsychological services. This provider further explained that video should not be captured, photos should not be taken, nor should testing stimuli be copied or recorded as it would be a copyright violation. Rachel Ferrell expressed understanding of the aforementioned, and verbally consented to proceed.  Rachel Ferrell completed the psychiatric diagnostic evaluation (history, including past, family, social, and  psychiatric history; behavioral observations; and establishment of a provisional diagnosis). The evaluation was completed in 115 minutes. Code [phone] was billed.   Mental Status Examination:  Appearance:  neat Behavior: appropriate to circumstances Mood: anxious Affect: mood congruent Speech: pressured Eye Contact: appropriate Psychomotor Activity: restless Thought Process: linear, logical, and goal directed and denies suicidal, homicidal, and self-harm ideation, plan and intent Content/Perceptual Disturbances: none Orientation: AAOx4 Cognition/Sensorium: intact Insight: good Judgment: good  Provisional DSM-5 diagnosis(es):  F89 Unspecified Disorder of Psychological Development  Plan: Rachel Ferrell is currently scheduled for an appointment on 03/05/2022 at [place] via WebEx video visit with audio.       CONFIDENTIAL PSYCHOLOGICAL EVALUATION ______________________________________________________________________________ Name: Rachel Ferrell   Date of Birth: May 11, 1981    Age: 85 Dates of Evaluation: 02/19/2022   SOURCE AND REASON FOR REFERRAL: Rachel Ferrell was referred by Dr. Laroy Apple for an evaluation to ascertain if she meets criteria for Attention Deficit/Hyperactivity Disorder  (ADHD).    BACKGROUND INFORMATION AND PRESENTING PROBLEM: Rachel Ferrell is a 41 year old female who resides in [place]  Rachel Ferrell discussed how her immediate family members having ADHD has led to "doing a lot of reading and research about ADHD" and noticing she "has a lot of the symptoms," although added that "a lot of symptoms  could be related to depression too." She described her ADHD-related concerns as occurring since childhood and including task initiation (e.g., not knowing "where to start" and "what to do" which can lead to feeling panicked and overwhelmed then "doing nothing" as well as stress, a sense of urgency, and "company coming over" being common motivators to start tasks she has put off), maintenance (e.g., forgetting what she was doing or becoming distracted by others tasks and starting them "without thought"), and disengagement (e.g., trouble stopping doing activities she enjoys or tasks with a sense of urgency which negatively impacts her ability to complete higher priority tasks); commonly "starting projects and then losing interest and not finishing it;" being easily distracted by a variety of stimuli (e.g., other tasks, her phone, "random Google searches," and her dogs); difficulty relaxing as her mind is frequently thinking of things she needs to do; regularly feeling bored, which can contribute to her interrupting others while they are working and utilizing her phone while attempting to watch passive entertainment; her "mind is always thinking" which can cause sleep onset issues; being prone to interrupting others as she "always feel like having trouble timing [her] talking," has concerns she will forget what she wants to say, and/or will "convince [herself] not to share [the thought] at all;" habitually fidgets (e.g., moving her knees and shifting her position); consistently misses small details or making careless mistakes (e.g., finding spelling and grammar mistakes in  emails despite efforts to proofread) which can lead to an extended time being required to complete work as she has to "check [her] work over and over again;" regular forgetfulness (e.g., appointments, events, tasks she was doing, and verbally presented information); difficulty listening to others despite them speaking directly to her, stating "it happens a lot" as her mind "wanders" to "what others were saying" and/or "random nonsense" thoughts; tending to drive [id number] over the speed limit and having received one-to-two past speeding tickets, although noting "safety concerns" assist in preventing her from driving faster; experiencing planning issues if she is not interested in the event(s) (e.g., household chores and dinner plans) as she is prone to forgetting to plans and having trouble sticking to them; trouble starting and maintaining organizational systems which contributes to clutter, feelings of guilt, and self-critical thoughts; impulsive purchases as she "gets sucked into things really easily," which then requires her to return items and causes difficulty in saving for long-term saving goals; frequently has to re-read steps of tasks to ensure she follows the proper procedure; generally underestimates the time requirements of tasks; and feeling overwhelmed and/or irritable if there are multiple pulls on her attention, loud noises in the environment, and someone pulls her attention from a task given the effort required to get her attention back on task. She also described a history of suicidal ideation without plan or intent with the last instance occurring in high school; social anxiety-related concerns (e.g., becoming "overwhelmed" in social situations as she is "not the most eloquent speaker," worries what she says will "come out wrong" and how others will perceive her which can cause her to participate less in conversations and others commenting on it, and "fixates" on perceived social mistakes she made  "for weeks"); periods of low mood, irritability, and social withdrawal that last a few days; generalized anxiety (e.g., concerns about her son's wellbeing given ADHD-related difficulties he experiences, trepidation about her husband's social behaviors and other's possible perceptions of it, and spending significant time considering different hypothetical scenarios of "what could go wrong");  emotional eating-related behaviors (e.g., craving sugary foods and eating until uncomfortably full) when encountering "feelings she does not know how to process;" and relational strains with her mother secondary to her being "difficult to talk to at times" and emotional distress resulting from her mother's decisions after her father's passing (e.g., her mother's fianc moving into her mother's home and pictures of her father being taken down). She expressed a belief her ADHD-related concerns are "consistent" but there are "rare" days where she "wakes up super productive," which causes frustration she is "not like that every day." She stated her coping and compensatory strategies include use of timers, lists, visual reminders, and calendars; checking her work; not doing activities that can lead to hyperfocus when she has other priorities to complete; "backtracking" to assist in remembering what she was doing; limiting her access to her phone; keeping a notepad next to her bed to write down thoughts she is having of what needs to be done to assist which reduces forgetfulness and assists in "calming" her mind; attaching various tasks to things she is interested in; trying to get at least 7 hours of sleep each night; use of a standing desk and walking pad for work; and challenging anxiety-related thoughts.   Ms. Monjaraz denied awareness of experiencing developmental milestone delays, grade retention, or learning disability diagnosis. She shared she was "academically gifted," finished high school in three years, started [employer] when  she was 41 years old, and graduated "top of class" when completing her master's degree in business. She stated she "enjoyed school" and is a Horticulturist, commercial," so she frequently completed assignments on time. She further stated she experienced "more social issues than academic," providing examples of how her brother's behaviors and a "couple bad experiences" with classmates in middle and high school "messed with [her] self-esteem." She stated in a prior employment role she had a "15 year history of barely getting anything done until [she] had to," adding how her intelligence facilitated her getting "a lot done in a short period of time" but also contributed to feelings of "inadequacy and imposter syndrome." She described her current employment as having more responsibilities which has created a sense of urgency and fulfillment, although expressed this is leading to burnout and is "not sustainable" as she has little time for self-care, often feels overwhelmed, and experiences exacerbated sleep issues.   Ms. Oatley described her medical history as significant for asthma and sinus issues. She denied awareness of ever experiencing seizures or head injuries. She reported initiating mental health services to process her emotional experiences while her father was physically ill, and that since this time she has been processing grief after his passing as well as childcare-related stressors. She reported use of a 12oz iced coffee daily, noting she avoids drinking it "too late in the day" as caffeine can cause jitteriness, sleep issues, and headaches. She denied use of all other recreational and illicit substances. She also denied ever experiencing psychiatric hospitalization; obsessions and compulsions; trauma- and stressor-related disorder symptomatology; abuse or neglect; homicidal ideation, plan, or intent; or legal involvement. She noted her familial mental health history is significant for her oldest son having ADHD and  possible ADHD in her younger son.  Chart review indicated she utilizes psychotherapeutic services with Dr. Laroy Apple and has been diagnosed with F300.0 Anxiety State, Unspecified. Dr. Melanee Left notes suggest Ms. Fugett experiences anxiety secondary to concerns about her son's wellbeing and difficulty processing various emotional experiences. Dr. Gaynell Face also appears to have recommended she  speak with an eating disorders specialist to "work through her relationship with food."             Dolores Lory, PsyD

## 2022-02-24 ENCOUNTER — Ambulatory Visit (INDEPENDENT_AMBULATORY_CARE_PROVIDER_SITE_OTHER): Payer: Federal, State, Local not specified - PPO | Admitting: Clinical

## 2022-02-24 DIAGNOSIS — F411 Generalized anxiety disorder: Secondary | ICD-10-CM

## 2022-02-24 NOTE — Progress Notes (Signed)
Diagnosis: Anxiety NOS, F41.9 Session time: 11:00pm-12:00pm CPT code: [phone]  Rachel Ferrell was seen using secure video conferencing. She was in her home and the therapist was in her office at the time of the appointment. Session focused on her difficulty trusting medical providers, as well as challenges in the work setting. Therapist suggested communication strategies while providing validation and support. She is scheduled to be seen again in one month.  Objectives Related Problem: Rachel Ferrell experiences anxiety related to her son's well-being, as well as difficulty processing emotions relating to current situations and past traumas Description: Rachel Ferrell will develop strategies to help her regulate and process her emotions, including her anxiety and grief Target Date: 2022-12-08 Frequency: Biweekly Modality: individual Progress: 20% Planned Intervention: Therapist will help Rachel Ferrell to identify and disengage from negative thought patterns using CBT-based strategies  Planned Intervention: Therapist will provide Rachel Ferrell with referrals to additional resources as appropriate  Related Problem: Rachel Ferrell experiences anxiety related to her son's well-being, as well as difficulty processing emotions relating to current situations and past traumas Description: Rachel Ferrell will be provided with opportunities to process past traumatic experiences Target Date: 2022-12-08 Frequency: Daily Modality: individual Progress: 30% Planned Intervention: Therapist will provide Rachel Ferrell with opportunities to process her experiences in session   Treatment Plan Client Abilities/Strengths  Rachel Ferrell presented as insightful and motivated to improve her situation.  Client Treatment Preferences  Rachel Ferrell reported that she has found therapy most helpful in the past when she worked with a therapist who confronted and challenged her.  Client Statement of Needs  Rachel Ferrell is seeking CBT to help her manage anxiety related to current stressors and past traumas.   Treatment Level  Biweekly  Symptoms  Anxiety: worried thoughts, difficulty relaxing, fears for the future, poor self-image, emerging social anxiety (Status: maintained). Depression: uncontrollable tearfulness, weight gain, emotional eating, depressed mood (Status: maintained).  Problems Addressed  New Description, New Description  Goals 1. Rachel Ferrell experiences anxiety related to her son's well-being, as well as difficulty processing emotions relating to current situations and past traumas Objective Rachel Ferrell will be provided with opportunities to process past traumatic experiences Target Date: 2022-12-08 Frequency: Daily  Progress: 30 Modality: individual  Related Interventions Therapist will help Rachel Ferrell to process her relationships with family members in order to better understand how she may be unwittingly repeating negative patterns from her past Therapist will provide Rachel Ferrell with opportunities to process her experiences in session Objective Rachel Ferrell will develop strategies to help her regulate and process her emotions, including her anxiety and grief Target Date: 2022-12-08 Frequency: Biweekly  Progress: 20 Modality: individual  Related Interventions Therapist will help Rachel Ferrell to identify and assert appropriate boundaries in her relationships, as well as provide her with communication strategies to help her communicate her thoughts and feelings Therapist will provide Rachel Ferrell with referrals to additional resources as appropriate Therapist will provide Rachel Ferrell with strategies to help her regulate her emotions, including deep breathing, meditation, mindfulness, and self-care Therapist will help Rachel Ferrell to identify and disengage from negative thought patterns using CBT-based strategies Therapist will engage Rachel Ferrell in behavior activation, including incorporation of pleasant and mastery events into her daily routine 2. Rachel Ferrell experiences grief and depressive symptoms related to her father's passing and her  challenging relationship with her mother Diagnosis Axis none 300.00 (Anxiety state, unspecified) - Open - [Signifier: n/a]    Conditions For Discharge Achievement of treatment goals and objectives        Rachel Cruise, PhD  Rachel Ferrell Rachel Ahmari Duerson, PhD 

## 2022-03-05 ENCOUNTER — Ambulatory Visit: Payer: Federal, State, Local not specified - PPO | Admitting: Psychology

## 2022-03-05 DIAGNOSIS — F89 Unspecified disorder of psychological development: Secondary | ICD-10-CM

## 2022-03-05 NOTE — Progress Notes (Signed)
Date: 03/05/2022   Appointment Start Time: 9am Duration: 75 minutes Provider: Clarice Pole, PsyD Type of Session: Testing Appointment for Evaluation  Location of Patient: Home Location of Provider: Provider's Home (private office) Type of Contact: WebEx video visit with audio  Session Content: Today's appointment was a telepsychological visit due to COVID-19. Altie is aware it is her responsibility to secure confidentiality on her end of the session. Prior to proceeding with today's appointment, Emlyn's physical location at the time of this appointment was obtained as well a phone number she could be reached at in the event of technical difficulties. Kareen denied anyone else being present in the room or on the virtual appointment. This provider reviewed that video should not be captured, photos should not be taken, nor should testing stimuli be copied or recorded as it would be a copyright violation. Alice expressed understanding of the aforementioned, and verbally consented to proceed. The WAIS-IV was administered, scored, and interpreted by this evaluator  Billing codes will be input on the feedback appointment. There are no billing codes for the testing appointment.   Provisional DSM-5 diagnosis(es):  F89 Unspecified Disorder of Psychological Development  Plan: Stpehanie was scheduled for a feedback appointment on 10/111/2023 at 9am via WebEx video visit with audio.                Dolores Lory, PsyD

## 2022-03-19 ENCOUNTER — Ambulatory Visit: Payer: Federal, State, Local not specified - PPO | Admitting: Psychology

## 2022-03-19 DIAGNOSIS — F902 Attention-deficit hyperactivity disorder, combined type: Secondary | ICD-10-CM

## 2022-03-19 NOTE — Progress Notes (Addendum)
Testing and Report Writing Information: The following measures  were administered, scored, and interpreted by this provider:  Generalized Anxiety Disorder-7 (GAD-7; 5 minutes), Patient Health Questionnaire-9 (PHQ-9; 5 minutes), Wechsler Adult Intelligence Scale-Fourth Edition (WAIS-IV; 60 minutes), CNS Vital Signs (45 minutes), Adult Attention Deficit/Hyperactivity Disorder Self-Report Scale Checklist (ASRSv1.1; 15 minutes), Behavior Rating Inventory for Executive Function - A - Self Report (BRIEF A; 10 minutes) and Behavior Rating Inventory for Executive Function - A - Informant (BRIEF-A; 10 minutes) , Personality Assessment Inventory (PAI; 50 minutes). A total of 200 minutes was spent on the administration and scoring of the aforementioned measures. Codes [phone] and [phone] (5 units) were billed.  Please see the assessment for additional details. This provider completed the written report which includes integration of patient data, interpretation of standardized test results, interpretation of clinical data, review of information provided by Rachel Ferrell and any collateral information/documentation, and clinical decision making (270 minutes in total).  Feedback Appointment: Date: 03/19/2022 Appointment Start Time: 9:03am Duration: 55 minutes Provider: Clarice Pole, PsyD Type of Session: Feedback Appointment for Evaluation  Location of Patient: Home Location of Provider: Provider's Home (private office) Type of Contact: Webex video visit with audio  Session Content: Today's appointment was a telepsychological visit due to COVID-19. Rachel Ferrell is aware it is her responsibility to secure confidentiality on her end of the session. She provided verbal consent to proceed with today's appointment. Prior to proceeding with today's appointment, Rachel Ferrell's physical location at the time of this appointment was obtained as well a phone number she could be reached at in the event of technical difficulties. Rachel Ferrell denied anyone  else being present in the room or on the virtual appointment.  This provider and Rachel Ferrell completed the interactive feedback session which includes reviewing the following measures: Generalized Anxiety Disorder-7 (GAD-7), Patient Health Questionnaire-9 (PHQ-9), Wechsler Adult Intelligence Scale-Fourth Edition (WAIS-IV), CNS Vital Signs, Adult Attention Deficit/Hyperactivity Disorder Self-Report Scale Checklist (ASRSv1.1), Behavior Rating Inventory for Executive Function - A - Self Report (Brief- A), Personality Assessment Inventory (PAI). During this interactive feedback session, results of the aforementioned measures, treatment recommendations, and diagnostic conclusions were discussed.   The interactive feedback session was completed today and a total of 55 minutes was spent on feedback. Code [phone] was billed for feedback session.   DSM-5 Diagnosis(es):  F90.2 Attention-Deficit/Hyperactivity Disorder, Combined Presentation, Moderate  Time Requirements: Assessment scoring and interpreting: 200 minutes (billing code [phone] and [phone] [5 units]) Feedback: 55 minutes (billing code [phone]) Report writing: 270 total minutes. 02/19/2022: 2:35-2:50pm and 7:30-7:45pm. 02/21/2022: 5:15-6:10pm and 8:20-8:45pm. 02/25/2022: 7:30-8am. 02/27/2022: 4:05-4:25pm. 03/05/2022: 5:10-5:25pm and 6:25-6:35pm. 03/06/2022: 5:50-6:10pm and 7:40-8:25pm; 03/08/2022: 9:55-10:15am. (billing code [phone] [4 units])  Plan: Rachel Ferrell provided verbal consent for her evaluation to be sent via e-mail. No further follow-up planned by this provider.        CONFIDENTIAL PSYCHOLOGICAL EVALUATION ______________________________________________________________________________ Name: Rachel Ferrell   Date of Birth: 05-Jun-1981    Age: 27 Dates of Evaluation: 02/19/2022, 02/25/2022, 02/27/2022, 03/05/2022, and 03/06/2022  SOURCE AND REASON FOR REFERRAL: Rachel Ferrell was referred by Dr. Laroy Ferrell for an evaluation to ascertain if she meets criteria for  Attention Deficit/Hyperactivity Disorder (ADHD).   EVALUATIVE PROCEDURES: Clinical Interview with [name] (02/19/2022) Wechsler Adult Intelligence Scale-Fourth Edition (WAIS-IV; 03/05/2022) CNS Vital Signs (02/25/2022) and Re-Administration (03/06/2022) Adult Attention Deficit/Hyperactivity Disorder Self-Report Scale Checklist (02/25/2022) Behavior Rating Inventory for Executive Function - A - Self Report Behavior Rating Inventory for Executive Function - A - Self Report (BRIEF-; 02/19/2022) and Informant (02/19/2022) Personality  Assessment Inventory (PAI; 02/27/2022) Patient Health Questionnaire-9 (PHQ-9) Generalized Anxiety Disorder-7 (GAD-7)   BACKGROUND INFORMATION AND PRESENTING PROBLEM: [name] is a 41 year old female who resides in [place].  Rachel Ferrell discussed her oldest son and husband are diagnosed with ADHD, which has led to her "doing a lot of reading and research about ADHD" and noticing she "ha[s] a lot of the symptoms;" however, she noted "a lot of symptoms could be related to depression too." She described her ADHD-related concerns as occurring since childhood and including task initiation (e.g., regularly "putting off any real work until last minute;" not knowing "where to start" and "what to do" which can lead to feeling panicked and overwhelmed then "doing nothing;" and stress, a sense of urgency, and "company coming over" being common motivators to start tasks she has put off), maintenance (e.g., forgetting what she was doing or becoming distracted by others tasks and starting them "without thought"), and disengagement (e.g., trouble stopping activities she enjoys or tasks with a sense of urgency which negatively impacts her ability to complete higher priority tasks) challenges; commonly "starting projects and then losing interest and not finishing it;" being easily distracted by a variety of stimuli (e.g., other tasks, her phone, "random Google searches," and her  dogs); difficulty relaxing as her mind is frequently thinking of things she needs to do; regularly feeling bored, which can contribute to her interrupting others while they are working and utilizing her phone while attempting to watch passive entertainment; her "mind is always thinking" which can cause sleep onset issues; being prone to interrupting others as she "always feel like having trouble timing [her] talking," has concerns she will forget what she wants to say, and/or will "convince [herself] not to share [the thought] at all;" habitually fidgeting (e.g., moving knees and shifting position); consistently missing small details or making careless mistakes (e.g., finding spelling and grammar mistakes in emails despite having proofread them before sending) which can lead to an extended time being required to complete work as she has to "check [her] work over and over again;" regular forgetfulness (e.g., appointments, events, tasks she was doing, and verbally presented information); difficulty listening to others despite them speaking directly to her, noting "it happens a lot" as her mind "wanders" to "what others were saying" and/or "random nonsense;" driving [phone] over the speed limit and having received one-to-two past speeding tickets, although noted "safety concerns" assist in preventing her from driving faster; experiencing planning issues if she is not interested in the event(s) (e.g., household chores and dinner plans) as she is prone to forgetting to plan and has trouble sticking to them; issues starting and maintaining organizational systems which contributes to clutter, feelings of guilt, and self-critical thoughts; making impulsive purchases as she "get[s] sucked into things really easily," which then requires her to return items and causes difficulty in achieving long-term saving goals; frequently re-reading steps of tasks to ensure she follows the proper procedure; generally underestimating the  time requirements of tasks; and feeling overwhelmed and/or irritable if there are multiple pulls on her attention, loud noises in the environment, and someone requests her attention while she is focused on a task given the effort she indicated is required to get her attention back on task.   Ms. Mckiver also described a history of suicidal ideation without plan or intent with the last instance occurring in high school; generalized- (e.g., concerns about her son's well-being given ADHD-related difficulties he experiences, trepidation about her husband's social behaviors and others'  possible perceptions of it, and spending significant time considering different hypothetical scenarios of "what could go wrong") and social anxiety-related concerns (e.g., becoming "overwhelmed" in social situations as she is "not the most eloquent speaker," worries what she says will "come out wrong" and how others will perceive her which can cause her to participate less in conversations and others commenting on it, and "fixates" on perceived social mistakes she made "for weeks"); periods of low mood, irritability, and social withdrawal that last a "few days;" emotional eating-related behaviors (e.g., craving sugary foods and eating until uncomfortably full) when encountering "feelings [she] do[es] not know how to process;" and relational strains with her mother that she attributed to her being "difficult to talk to at times" and emotional distress resulting from her decisions after her father's passing (e.g., her mother's fianc moving into her mother's home and pictures of her father were taken down). She expressed a belief her ADHD-related concerns are "consistent" but there are "rare" days where she "wakes up super productive," which causes frustration she is "not like that every day." Ms. Hosie stated her coping and compensatory strategies include using timers, lists, visual reminders, and calendars; checking her work; not doing  activities that can lead to hyperfocusing when she has other priorities to complete; "backtracking" to assist in remembering what she was doing; limiting her access to her phone; keeping a notepad next to her bed to write down thoughts she is having of what needs to be done to assist in reducing forgetfulness and "calming" her mind; attaching lower interest tasks to things she is interested in; trying to get at least seven hours of sleep each night; using a standing desk and walking pad for work; and challenging anxiety-related thoughts.   Ms. Guyton denied awareness of experiencing developmental milestone delays, grade retention, or learning disability diagnosis. She shared she was "academically gifted," finished high school in three years, started college when she was 41 years old, and graduated "top of class" when completing her master's degree in business. Ms. Kronick stated she "enjoyed school" and is a "rule follower," which contributed to her regularly completing assignments on time. She further stated she remembers being "bored" as well as "dood[ing]" and "talk[ing] to boys" during class and leaving her seat to go to the bathroom at a frequency that caused her to "get in trouble." Ms. Mariscal further recalled she experienced "more social issues than academic," and provided examples of how her brother's behaviors and a "couple bad experiences" with classmates in middle and high school "messed with [her] self-esteem." She stated in a prior employment role she had a "15-year history of barely getting anything done until [she] had to," adding her intelligence facilitated her getting "a lot done in a short period of time" but also contributed to feelings of "inadequacy and imposter syndrome." Ms. Thorns described her current employment as having more responsibilities which has created a sense of urgency and fulfillment, although expressed this is leading to burnout and is "not sustainable," as she has little time  for self-care, often feels overwhelmed, and experiences exacerbated sleep issues.   Ms. Spoonemore described her medical history as significant for asthma and sinus issues. She denied awareness of ever experiencing seizures or head injuries. She reported initiating mental health services with Dr. Gaynell Face to process her emotional experiences while her father was physically ill, and since this time she has been processing grief after his passing as well as childcare-related stressors. She reported use of a 12oz iced coffee daily,  noting she avoids drinking it "too late in the day" as caffeine can cause jitteriness, sleep issues, and headaches. Ms. Montag denied use of all other recreational and illicit substances. She also denied ever experiencing psychiatric hospitalization; obsessions and compulsions; trauma- and stressor-related disorder symptomatology; abuse or neglect; homicidal ideation, plan, or intent; or legal involvement. She noted her familial mental health history is significant for her oldest son being diagnosed with ADHD and her youngest son possibly meeting criteria for ADHD.  CHART REVIEW: Ms. Wirkkala attends psychotherapeutic services with Dr. Laroy Ferrell and has been diagnosed with F300.0 Anxiety State, Unspecified. Dr. Melanee Left notes suggest Ms. Colquhoun experiences anxiety secondary to concerns about her son's well-being and difficulty processing various emotional experiences. Dr. Gaynell Face also appears to have recommended she speak with an eating disorders specialist to "work through her relationship with food."   BEHAVIORAL OBSERVATIONS: Ms. Noack presented on time for the evaluation. She was well-groomed. She was oriented to time, place, person, and purpose of the appointment. During the evaluation Ms. Korzeniewski often verbalized uncertainty about whether she was answering items correctly (e.g., stating answers with a questioning tone or saying, "I don't know" after providing a correct  answer), self-expression difficulties (e.g., stating "I do not know what I am saying" and "I don't know. It is hard for me to explain"), and working memory-related problems (e.g., noting "I am getting lost now," "I got mixed up in the middle," and "My mind went blank there for a second. Sorry."). She described the WAIS-IV administration as anxiety provoking as it was similar to "tests" her father completed as part of an evaluation to rule in or out a neurocognitive disorder. Throughout the course of the evaluation, she maintained appropriate eye contact. Her thought processes and content were logical, coherent, and goal-directed. There was no evidence of paraphasias (i.e., errors in speech, gross mispronunciations, and word substitutions), repetition deficits, or disturbances in volume or prosody (i.e., rhythm and intonation). Overall, based on Ms. Mattila's approach to testing, the current results are believed to be a fair-good estimate of her abilities.  PROCEDURAL CONSIDERATIONS:  Psychological testing measures were conducted through a virtual visit with video and audio capabilities, but otherwise in a standard manner.   The Wechsler Adult Intelligence Scale, Fourth Edition (WAIS-IV) was administered via remote telepractice using digital stimulus materials on [employer]'s Q-global system. The remote testing environment appeared free of distractions, adequate rapport was established with the examinee via video/audio capabilities, and Ms. Stegall appeared appropriately engaged in the task throughout the session. No significant technological problems or distractions were noted during administration. Modifications to the standardization procedure included: none. The WAIS-IV subtests, or similar tasks, have received initial validation in several samples for remote telepractice and digital format administration, and the results are considered a valid description of Ms. Riviere's skills and abilities.  CLINICAL  FINDINGS:  COGNITIVE FUNCTIONING  Wechsler Adult Intelligence Scale, Fourth Edition (WAIS-IV):  Ms. Leavelle completed subtests of the WAIS-IV, a full-scale measure of cognitive ability. She completed subtests of the WAIS-IV, a full-scale measure of cognitive ability. The WAIS-IV is comprised of four indices that measure cognitive processes that are components of intellectual ability; however, only subtests from the Verbal Comprehension and Working Memory indices were administered. As a result, Full-Scale-IQ (FSIQ) and General Ability Index (GAI) were unable to be determined.   WAIS-IV Scale/Subtest IQ/Scaled Score 95% Confidence Interval Percentile Rank Qualitative Description  Verbal Comprehension (VCI) 120 114-125 91 Superior  Similarities 17     Vocabulary 14  Information 10     Working Memory ([place]) 128 120-133 97 Superior  Digit Span 15     Arithmetic 15       The Verbal Comprehension Index (VCI) provides a measure of one's ability to receive, comprehend, and express language. It also measures the ability to retrieve previously learned information and to understand relationships between words and concepts presented orally. Ms. Vanbuskirk obtained a VCI scaled score of 120 (91st percentile) placing her in the superior range compared to same-aged peers. Her performance on the subtests comprising this index was diverse. Out of the three subtests, Ms. Slagel demonstrated the strongest performance on the Similarities subtest, which measured her ability to abstract meaningful concepts and relationships from verbally presented material. Her lowest performance was on the Information subtest which is primarily a measure of her fund of general knowledge but may also be influenced by cultural experience, quality of education, and ability to retrieve information from long-term memory.    The Working Memory Index ([place]) provides a measure of one's ability to sustain attention, concentrate, and exert mental  control. Ms. Osbourn obtained a WMI scaled score of 128 (97th percentile), placing her in the superior range compared to same-aged peers. Ms. Tiller demonstrated similar performance on the subtests comprising this index.  ATTENTION AND PROCESSING  CNS Vital Signs: The CNS Vital Signs assessment evaluates the neurocognitive status of an individual and covers a range of mental processes. The results of the CNS Vital Signs testing indicated average neurocognitive processing ability. Regarding attention, simple attention and complex attention were average. Sustained attention was a relative weakness in the low average range, although her results were deemed potentially invalid. Executive functioning and cognitive flexibility were average. Working memory was impaired in the low range, although her results were deemed potentially invalid. Psychomotor speed, reaction time, and processing speed were in the above range, which suggests hand-eye, coordination, thinking speed, and responsiveness are strengths. Motor speed was average. Visual memory (images) was average and verbal memory (words) was low, which indicates visual memory is a relative strength and verbal memory is impaired. The results suggest Ms. Robbs experiences strengths in psychomotor speed, reaction time, and processing speed; a weakness in sustained attention; and impairment in verbal memory and working memory. Upon follow-up, Ms. Rochel noted she "forgot to [fully] read the instructions" on Part 4 of the CPT, which led to her not understanding what to do on the task. As a result, the Four-Part Continuous Performance Test was re-administered. Upon re-administration, Ms. Miltner working memory and sustained attention were deemed valid. Her working memory was in the average range and sustained attention was in the above range; however, her improved performance on these measures may be at least partially explained by reduced cognitive and attentional  demands given the abbreviated nature of the re-administration.   Domain  Standard Score Percentile Validity Indicator Guideline  Neurocognitive Index 106 66 Yes Average  Composite Memory 90 25 Yes Average  Verbal Memory 76 5 Yes Low  Visual Memory 107 68 Yes Average  Psychomotor Speed 119 90 Yes Above  Reaction Time 110 75 Yes Above  Complex Attention 105 63 Yes Average  Cognitive Flexibility 107 68 Yes Average  Processing Speed  129 97 Yes Above  Executive Function 106 66 Yes Average  Working Memory 71 3 No Low  Working Memory Re-Administration 109 73 Yes Average  Sustained Attention 87 19 No Low Average  Sustained Attention Re-Administration 110 75 Yes Above  Simple Attention 94 34 Yes Average  Motor Speed 105 63 Yes Average   EXECUTIVE FUNCTION  Behavior Rating Inventory of Executive Function, Second Edition (BRIEF-A) Self-Report: Ms. Brosch completed the Self-Report Form of the Behavior Rating Inventory of Executive Function-Adult Version (BRIEF-A), which has three domains that evaluate cognitive, behavioral, and emotional regulation, and a Global Executive Composite score provides an overall snapshot of executive functioning. There are no missing item responses in the protocol. The Negativity, Infrequency, and Inconsistency scales are not elevated, suggesting she did not respond to the protocol in an overly negative, haphazard, extreme, or inconsistent manner. In the context of these validity considerations, ratings of Ms. Podoll's everyday executive function suggest some areas of concern. The overall index, the Global Executive Composite (GEC), was elevated (GEC T = 76, %ile = 99). Both the Behavioral Regulation (BRI) and the Metacognition (MI) Indexes were elevated (BRI T = 67, %ile = 97 and MI T = 80, %ile = >99). Ms. Castagnola indicated difficultly with her ability to modulate emotions, initiate problem solving or activity, sustain working memory, plan and organize problem-solving  approaches, attend to task-oriented output, and organize environment and materials. She did not describe her ability to inhibit impulsive responses, adjust to changes in routine or task demands, and monitor social behavior as problematic; although monitoring social behavior and adjusting to changes in routine or task demands approached an abnormal elevation.   Scale/Index  Raw Score T Score Percentile  Inhibit 13 55 86  Shift 11 61 93  Emotional Control 24 74 99  Self-Monitor 11 60 88  Behavioral Regulation Index (BRI) 59 67 97  Initiate 20 80 >99  Working Memory 19 79 >99  Plan/Organize 22 74 98  Task Monitor 14 76 >99  Organization of Materials 19 71 98  Metacognition Index (MI) 94 80 >99  Global Executive Composite (GEC) 153 76 99   Validity Scale Raw Score Cumulative Percentile Protocol Classification  Negativity 2 0 - 98.3 Acceptable  Infrequency 0 0 - 97.3 Acceptable  Inconsistency 4 0 - 99.2 Acceptable   Behavior Rating Inventory of Executive Function, Second Edition (BRIEF-A) Informant:  Ms. Lopezperez's spouse, Mr. Mesa Janus, completed the Informant Form of the Behavior Rating Inventory of Executive Function-Adult Version (BRIEF-A), which is equivalent to the Self-Report version and has three domains that evaluate cognitive, behavioral, and emotional regulation, and a Global Executive Composite score provides an overall snapshot of executive functioning. There are no missing item responses in the protocol. The Negativity, Infrequency, and Inconsistency scales are not elevated, suggesting he did not respond to the protocol in an overly negative, haphazard, extreme, or inconsistent manner. In the context of these validity considerations, Mr. Sundstrom ratings of Ms. Hackbart's everyday executive function suggest some areas of concern. The overall index, the Global Executive Composite (GEC), was within the non-elevated range for age ([place] T = 60, %ile = 86). The Behavioral Regulation (BRI) and  Metacognition (MI) Indexes were within normal limits (BRI T = 62, %ile = 89 and MI T = 58, %ile = 81). Mr. Freehling indicated Ms. Gagliano experiences difficultly with her ability to modulate emotions and initiate problem solving or activity. Mr. Kemmer did not describe her ability to inhibit impulsive responses, adjust to changes in routine or task demands, monitor social behavior, sustain working memory, plan and organize problem-solving approaches, attend to task-oriented output, and organize environment and materials as problematic.  Scale/Index  Raw Score T Score Percentile  Inhibit 11 49 68  Shift 8 47 54  Emotional Control 30 79 >99  Self-Monitor 9 50 64  Behavioral Regulation Index (BRI) 58 62 89  Initiate 17 67 95  Working Memory 11 51 72  Plan/Organize 17 58 80  Task Monitor 10 57 82  Organization of Materials 13 51 68  Metacognition Index (MI) 68 58 81  Global Executive Composite (GEC) 126 60 86   Validity Scale Raw Score Cumulative Percentile Protocol Classification  Negativity 2 0 - 98.5 Acceptable  Infrequency 1 0 - 93.3 Acceptable  Inconsistency 2 0 - 98.8 Acceptable   BEHAVIORAL FUNCTIONING   Patient Health Questionnaire-9 (PHQ-9): Ms. Ketchem completed the PHQ-9, a self-report measure that assesses symptoms of depression. She scored 7/27, which indicates mild depression.   Generalized Anxiety Disorder-7 (GAD-7): Ms. Neville completed the GAD-7, a self-report measure that assesses symptoms of anxiety. She scored 3/21, which indicates minimal anxiety.   Adult ADHD Self-Report Scale Symptom Checklist (ASRS): Ms. Mcginty reported the following symptoms as sometimes: difficulty getting things in order when a task requires organization, fidgeting or squirming, struggling to concentrate on what people say even when they are speaking directly to him, misplacing or having difficulty finding things, feeling restless or fidgety, and interrupting others or finishing their sentences. She  endorsed the following symptoms as occurring often: difficulty wrapping up final details of a project following the completion of challenging aspects, problems remembering appointments or obligations, making careless mistakes when working on boring or difficult projects, struggling to sustain attention when doing boring or repetitive work, being distracted by noise around her, difficulty relaxing, and interrupting others when they are busy. She endorsed the following symptom as very often: avoiding or delaying getting started on tasks requiring a lot of thought. Endorsement of at least four items in Part A is highly consistent with ADHD in adults. The frequency scores of Part B provides additional cues. Ms. Leber scored a 4/6 on Part A and 7/12 on Part B, which is considered a positive screening for ADHD.   Personality Assessment Inventory (PAI): The PAI is an objective inventory of adult personality. The validity indicators suggested Ms. Buxbaum's profile is interpretable (ICN T = 58, INF T = 51, NIM T = 51, and PIM T = 38). She is endorsing a loosening of associations and difficulties with self-expression and communication (SCZ-T T = 70) that may be at least partially explaining her not desiring or enjoying close relationships with others, and use of social isolation and detachment to decrease the sense of discomfort that interpersonal contact fosters (SCZ-S T = 76, ARD-P T - 62, and WRM T = 40). Moreover, she is endorsing having few close interpersonal relationships or being dissatisfied with the nature of the relationships (NON T = 64). She appears to acknowledge the need to make some changes, has a positive attitude toward the possibility of personal change, and accepts the importance of personal responsibility (RXR T = 44).     SUMMARY AND CLINICAL IMPRESSIONS: Ms. Jadin Kagel is a 41 year old female who was referred by Dr. Laroy Ferrell for an evaluation to determine if she currently meets criteria  for a diagnosis of Attention-Deficit/Hyperactivity Disorder (ADHD).   Ms. Meenach explained she is pursuing an ADHD evaluation as information she has been reading is similar to her experiences, although noted "a lot of symptoms could be related to depression too." She also described a history of suicidal ideation without plan or intent with the last instance occurring in high school; generalized- and social anxiety-related concerns; periods of low mood, irritability, and social withdrawal that lasts "  a few days;" emotional eating-related behaviors; and grief from her father's passing that is being exacerbated by relational strains with her mother. She expressed a belief her ADHD-related concerns are "consistent" but there are "rare" days where she "wake[s] up super productive," which causes frustration she is "not like that every day."  During the evaluation, Ms. [place] was administered assessments to measure her current cognitive abilities. Her verbal comprehension abilities were in the superior range, and she demonstrated the strongest performance on the Similarities subtest, which required her to abstract meaningful concepts and relationships from verbally presented material. Her lowest performance was on the Information subtest, a measure of a fund of general knowledge that can be influenced by cultural experience, quality of education, and ability to retrieve information from long-term memory. Her ability to sustain attention, concentrate, and exert mental control was in the superior range and similarly developed. Results of the CNS Vital Signs indicated an average neurocognitive processing ability with strengths in psychomotor speed, reaction time, and processing speed; a weakness in sustained attention; and impairment in verbal memory and working memory, although working memory and sustained attention were deemed potentially invalid. Upon follow-up, Ms. Witcher noted she "forgot to [fully] read the  instructions" on Part 4 of the CPT, which led to her not understanding what to do on this portion of the test resulting in re-administration. On the re-administration, working memory was in the average range and sustained attention was in the above range; however, it is important to note the improved performance on these measures may be at least partially explained by reduced cognitive demands from an abbreviated re-administration.  During the clinical interview and on self-report measures, Ms. Chiong endorsed executive functioning impairment and attentional dysregulation, hyperactivity- and impulsivity-related symptoms, and meeting full criteria for ADHD. Ms. Minton spouse, Mr. Evelene Roussin, indicated she experiences executive functioning issues but at a lower extent than Ms. Doerr's perceives herself as experiencing. Making a diagnostic conclusion on the presence of ADHD was made difficult by invalid test results; however, Ms. Arshad attributed the potentially invalid results to an ADHD-related concern and her re-administration results were deemed valid, and she noted during the clinical interview her husband has been diagnosed with ADHD which may have at least partially contributed to a differing perception of the severity and frequency of these concerns as well as during follow-up shared she often tries to hide ADHD-related issues from others. When considering self-reported symptoms that she noted are independent of mood and have occurred since childhood; endorsed working memory and executive functioning problems; and a reported familial history of ADHD, a diagnosis of F90.2 Attention-Deficit/Hyperactivity Disorder, Combined Presentation, Moderate appears warranted. The specifier of "Moderate" was assigned as she indicated her symptoms cause impairment in social, occupational, and daily functioning. Ms. Record also endorsed generalized- and social anxiety-related concerns; periods of low mood, irritability,  and social withdrawal that lasts "a few days;" and emotional eating-related behaviors; however, given the limited scope of this evaluation, it was unable to be determined if full criteria for a depressive disorder, anxiety disorder(s), or a eating disorder are met and/or if the symptoms are better explained by a diagnosis of ADHD. As such, she would likely benefit from further evaluation of these symptoms to definitively rule in or out the aforementioned disorders. If any of the aforementioned disorders are ruled in, they are likely in addition to her diagnosis of ADHD as she indicated her ADHD-related concerns are independent of mood. Ms. Krahn also indicated her inattention is regularly caused by  a gravitation toward interesting or enjoyable stimuli versus primarily worry and rumination; however, worries are likely to exacerbate her inattention.   DSM-5 Diagnostic Impressions: F90.2 Attention-Deficit/Hyperactivity Disorder, Combined Presentation, Moderate  RECOMMENDATIONS: Ms. Brubacher would likely benefit from making use of strategies for ADHD symptoms:  Setting a timer to complete tasks. Breaking tasks into manageable chunks and spreading them out over longer periods of time with breaks.  Utilizing lists and day calendars to keep track of tasks.  Answering emails daily.  Improving listening skills by asking the speaker to give information in smaller chunks and asking for explanation for clarification as needed. Leaving more than the anticipated time to complete tasks. It may help to keep tasks brief, well within your attention span, and a mix of both high and low interest tasks. Tasks may be gradually increased in length. Practice proactive planning by setting aside time every evening to plan for the next day (e.g., prepare needed materials or pack the car the night before).  Learn how to make an effective and reasonable "to do" list of important tasks and priorities and always keep it easily  accessible. Make additional copies in case it is lost or misplaced. Utilize visual reminders by posting appointments, "to do lists," or schedule in strategic areas at home and at work.  Practice using an appointment book, smart phone or other tech device, or a daily planning calendar, and learn to write down appointments and commitments immediately. Keep notepads or use a portable audio recorder to capture important ideas that would be beneficial to recall later. Learn and practice time management skills. Purchase a programmable alarm watch or set an alarm on smartphone to avoid losing track of time.  Use a color-coded file system, desk and closet organizers, storage boxes, or other organization devices to reduce clutter and improve efficiency and structure.  Implement ways to become more aware of your actions and to inhibit or adjust them as warranted (e.g., reviewing videos of your actions, consider consequences of obeying or not obeying the rules of various upcoming situations, have a trusted other to discuss plans with and/or provide cues to stop certain behaviors, and make visual cues for rules you would like to follow). Stay flexible and be prepared to change your plans as symptom breakthroughs and crises are likely to occur periodically. Ms. Kloos may benefit from mindfulness training to address symptoms of inattention.  Ms. Zeoli would likely benefit from a consultation regarding medication for ADHD symptoms.   Individual therapeutic services may assist in processing a diagnosis of ADHD and in improving coping skills for ADHD-related symptoms. Mental alertness/energy can be raised by increasing exercise; improving sleep; eating a healthy diet; and managing stress. Consulting with a physician regarding any changes to physical regimen is recommended. "Failing at Normal: An ADHD Success Story" by Mellody Drown is a great overview of ADHD.  Organizations that are a good source of information on  ADHD:  Children and Adults with Attention-Deficit/Hyperactivity Disorder (CHADD): chadd.org  Attention Deficit Disorder Association (ADDA): CondoFactory.com.cy ADD Resources: addresources.org ADD WareHouse: addwarehouse.com World Federation of ADHD: adhd-federation.org ADDConsults: https://www.hines.net/. Future evaluation if deemed necessary and/or to determine effectiveness of recommended interventions.   Rachel Ferrell, Psy.D. Licensed Psychologist - [facility] #[id number]             Dolores Lory, PsyD

## 2022-03-21 ENCOUNTER — Ambulatory Visit (INDEPENDENT_AMBULATORY_CARE_PROVIDER_SITE_OTHER): Payer: Federal, State, Local not specified - PPO | Admitting: Clinical

## 2022-03-21 DIAGNOSIS — F411 Generalized anxiety disorder: Secondary | ICD-10-CM | POA: Diagnosis not present

## 2022-03-21 NOTE — Progress Notes (Signed)
Diagnosis: Anxiety NOS, F41.9 Session time: 10:00am-11:00 am CPT code: [id number]  Rachel Ferrell was seen using secure video conferencing. She was in her home and the therapist was in her office at the time of the appointment. She had completed homework of looking into and calling potential PCPs, but had not actually scheduled an appointment due to providers not accepting new patients. Therapist offered to assist her with a referral to [facility], and she expressed interest in this. She had received results from her evaluation, and therapist engaged her in processing these results. She is scheduled to be seen again in one month. Objectives Related Problem: Rachel Ferrell experiences anxiety related to her son's well-being, as well as difficulty processing emotions relating to current situations and past traumas Description: Rachel Ferrell will develop strategies to help her regulate and process her emotions, including her anxiety and grief Target Date: 2022-12-08 Frequency: Biweekly Modality: individual Progress: 20% Planned Intervention: Therapist will help Rachel Ferrell to identify and disengage from negative thought patterns using CBT-based strategies  Planned Intervention: Therapist will provide Rachel Ferrell with referrals to additional resources as appropriate  Related Problem: Rachel Ferrell experiences anxiety related to her son's well-being, as well as difficulty processing emotions relating to current situations and past traumas Description: Rachel Ferrell will be provided with opportunities to process past traumatic experiences Target Date: 2022-12-08 Frequency: Daily Modality: individual Progress: 30% Planned Intervention: Therapist will provide Rachel Ferrell with opportunities to process her experiences in session   Treatment Plan Client Abilities/Strengths  Rachel Ferrell presented as insightful and motivated to improve her situation.  Client Treatment Preferences  Rachel Ferrell reported that she has found therapy most helpful in the past when she worked  with a therapist who confronted and challenged her.  Client Statement of Needs  Rachel Ferrell is seeking CBT to help her manage anxiety related to current stressors and past traumas.  Treatment Level  Biweekly  Symptoms  Anxiety: worried thoughts, difficulty relaxing, fears for the future, poor self-image, emerging social anxiety (Status: maintained). Depression: uncontrollable tearfulness, weight gain, emotional eating, depressed mood (Status: maintained).  Problems Addressed  New Description, New Description  Goals 1. Rachel Ferrell experiences anxiety related to her son's well-being, as well as difficulty processing emotions relating to current situations and past traumas Objective Rachel Ferrell will be provided with opportunities to process past traumatic experiences Target Date: 2022-12-08 Frequency: Daily  Progress: 30 Modality: individual  Related Interventions Therapist will help Rachel Ferrell to process her relationships with family members in order to better understand how she may be unwittingly repeating negative patterns from her past Therapist will provide Rachel Ferrell with opportunities to process her experiences in session Objective Rachel Ferrell will develop strategies to help her regulate and process her emotions, including her anxiety and grief Target Date: 2022-12-08 Frequency: Biweekly  Progress: 20 Modality: individual  Related Interventions Therapist will help Rachel Ferrell to identify and assert appropriate boundaries in her relationships, as well as provide her with communication strategies to help her communicate her thoughts and feelings Therapist will provide Rachel Ferrell with referrals to additional resources as appropriate Therapist will provide Rachel Ferrell with strategies to help her regulate her emotions, including deep breathing, meditation, mindfulness, and self-care Therapist will help Rachel Ferrell to identify and disengage from negative thought patterns using CBT-based strategies Therapist will engage Rachel Ferrell in behavior  activation, including incorporation of pleasant and mastery events into her daily routine 2. Rachel Ferrell experiences grief and depressive symptoms related to her father's passing and her challenging relationship with her mother Diagnosis Axis none 300.00 (Anxiety state, unspecified) - Open - [Signifier: n/a]  Conditions For Discharge Achievement of treatment goals and objectives        [name], PhD               [name], PhD               [name], PhD

## 2022-04-21 DIAGNOSIS — M25512 Pain in left shoulder: Secondary | ICD-10-CM | POA: Diagnosis not present

## 2022-04-21 DIAGNOSIS — M7660 Achilles tendinitis, unspecified leg: Secondary | ICD-10-CM | POA: Diagnosis not present

## 2022-04-23 ENCOUNTER — Encounter: Payer: Self-pay | Admitting: Adult Health

## 2022-04-23 ENCOUNTER — Ambulatory Visit: Payer: Federal, State, Local not specified - PPO | Admitting: Adult Health

## 2022-04-23 VITALS — BP 100/70 | HR 76 | Temp 97.9°F | Ht 65.25 in | Wt 160.0 lb

## 2022-04-23 DIAGNOSIS — Z7689 Persons encountering health services in other specified circumstances: Secondary | ICD-10-CM

## 2022-04-23 DIAGNOSIS — J45998 Other asthma: Secondary | ICD-10-CM

## 2022-04-23 DIAGNOSIS — F909 Attention-deficit hyperactivity disorder, unspecified type: Secondary | ICD-10-CM

## 2022-04-23 DIAGNOSIS — M25512 Pain in left shoulder: Secondary | ICD-10-CM | POA: Diagnosis not present

## 2022-04-23 DIAGNOSIS — F411 Generalized anxiety disorder: Secondary | ICD-10-CM | POA: Diagnosis not present

## 2022-04-23 DIAGNOSIS — M7660 Achilles tendinitis, unspecified leg: Secondary | ICD-10-CM | POA: Diagnosis not present

## 2022-04-23 MED ORDER — AMPHETAMINE-DEXTROAMPHET ER 10 MG PO CP24: 10.0000 mg | ORAL_CAPSULE | Freq: Every day | ORAL | 0 refills | Status: DC

## 2022-04-23 NOTE — Patient Instructions (Addendum)
It was great seeing you today   I am going to trial Adderall 10 mg XR, let me know how you do with this   Please follow up at some point in time for your physical exam

## 2022-04-23 NOTE — Progress Notes (Unsigned)
Rachel Ferrell presents to clinic today to establish care. She is a pleasant 41 year old female who  has a past medical history of Asthma, Diverticulosis, and GERD (gastroesophageal reflux disease).  She is a former Rachel Ferrell of    Acute Concerns: Establish Care  Chronic Issues: Generalized Anxiety Disorder - is seen by Dr. Gaynell Face. She feels like her symptoms are well controlled for the most part.   ADHD - recently diagnosed by Dr. Mallie Mussel. She is unsure is she is ready to start medication.   Asthma - managed by [facility] - currently prescribed Albuterol rescue inhaler, Breo Ellipta 200-25 mcg, Spirivia 1.25 mcg and Singulair 10 mg QHS. She has not used [place] ad [place] for about a year.   Chronic Sinusitis - managed by [facility]. Seen every 6 months   Health Maintenance: Dental -- Routine Care  Vision -- Routine Care  Immunizations -- Colonoscopy -- 2023 due to diverticulitis - had multiple polyps and is on the 3 year plan.  PAP -- UTD - had one in 2023.   Past Medical History:  Diagnosis Date   Asthma    Diverticulosis    GERD (gastroesophageal reflux disease)     Past Surgical History:  Procedure Laterality Date   SINUS IRRIGATION      Current Outpatient Medications on File Prior to Visit  Medication Sig Dispense Refill   albuterol (PROVENTIL HFA;VENTOLIN HFA) 108 (90 BASE) MCG/ACT inhaler Inhale 2 puffs into the lungs every 6 (six) hours as needed for wheezing.     amoxicillin-clavulanate (AUGMENTIN) 875-125 MG tablet Take 1 tablet by mouth 2 (two) times daily. (Rachel Ferrell not taking: Reported on 02/12/2022) 10 tablet 0   Cholecalciferol (VITAMIN D) 2000 units tablet Take 2,000 Units by mouth See admin instructions. Take 1 tablet (2000 units) by mouth daily during winter months and 1 tablet (2000 units) every other day during summer months     dicyclomine (BENTYL) 20 MG tablet Take 1 tablet (20 mg total) by mouth every 6 (six) hours. 30 tablet 1   fluticasone  (FLONASE) 50 MCG/ACT nasal spray Place 1 spray into both nostrils at bedtime.      fluticasone furoate-vilanterol (BREO ELLIPTA) 200-25 MCG/INH AEPB Inhale 1 puff into the lungs daily.     montelukast (SINGULAIR) 10 MG tablet Take 10 mg by mouth at bedtime.     Multiple Vitamin (MULTIVITAMIN WITH MINERALS) TABS tablet Take 2 tablets by mouth 2 (two) times daily. [employer] brand     Tiotropium Bromide Monohydrate (SPIRIVA RESPIMAT) 1.25 MCG/ACT AERS Take 1.25 mg by mouth daily. Inhale 2 puffs by mouth daily     No current facility-administered medications on file prior to visit.    No Known Allergies  Family History  Problem Relation Age of Onset   Hypertension Other    Cancer - Colon Neg Hx    Stomach cancer Neg Hx    Esophageal cancer Neg Hx    Pancreatic cancer Neg Hx    Colon cancer Neg Hx    Rectal cancer Neg Hx     Social History   Socioeconomic History   Marital status: Married    Spouse name: Not on file   Number of children: 2   Years of education: Not on file   Highest education level: Not on file  Occupational History    Employer: [employer]    Comment: Audits medical claims  Tobacco Use   Smoking status: Never   Smokeless tobacco:  Never  Vaping Use   Vaping Use: Never used  Substance and Sexual Activity   Alcohol use: No   Drug use: No   Sexual activity: Yes    Birth control/protection: None  Other Topics Concern   Not on file  Social History Narrative   Not on file   Social Determinants of Health   Financial Resource Strain: Not on file  Food Insecurity: Not on file  Transportation Needs: Not on file  Physical Activity: Not on file  Stress: Not on file  Social Connections: Not on file  Intimate Partner Violence: Not on file    ROS  There were no vitals taken for this visit.  Physical Exam  Recent Results (from the past 2160 hour(s))  TSH     Status: None   Collection Time: 02/12/22 10:05 AM  Result Value Ref Range   TSH 1.98  0.35 - 5.50 uIU/mL  Tissue transglutaminase, IgA     Status: None   Collection Time: 02/12/22 10:05 AM  Result Value Ref Range   (tTG) Ab, IgA <1.0 U/mL    Comment: Value          Interpretation -----          -------------- <15.0          Antibody not detected > or = 15.0    Antibody detected .   IgA     Status: None   Collection Time: 02/12/22 10:05 AM  Result Value Ref Range   Immunoglobulin A 136 47 - 250 mg/dL  Helicobacter pylori special antigen     Status: None   Collection Time: 02/13/22  7:57 AM  Result Value Ref Range   MICRO NUMBER: [id number]    SPECIMEN QUALITY Adequate    SOURCE: STOOL    STATUS: FINAL    RESULT:      Not Detected  Antimicrobials, proton pump inhibitors, and bismuth preparations inhibit H. pylori and ingestion up to two weeks prior to testing may cause false negative results. If clinically indicated the test should be repeated on a new specimen  obtained two weeks after discontinuing treatment.     Comment: Reference Range:Not Detected    Assessment/Plan: No problem-specific Assessment & Plan notes found for this encounter.

## 2022-04-24 ENCOUNTER — Ambulatory Visit: Payer: Federal, State, Local not specified - PPO | Admitting: Clinical

## 2022-04-29 ENCOUNTER — Encounter: Payer: Self-pay | Admitting: Adult Health

## 2022-05-02 DIAGNOSIS — M25512 Pain in left shoulder: Secondary | ICD-10-CM | POA: Diagnosis not present

## 2022-05-02 DIAGNOSIS — M7660 Achilles tendinitis, unspecified leg: Secondary | ICD-10-CM | POA: Diagnosis not present

## 2022-05-05 ENCOUNTER — Ambulatory Visit (INDEPENDENT_AMBULATORY_CARE_PROVIDER_SITE_OTHER): Payer: Federal, State, Local not specified - PPO | Admitting: Clinical

## 2022-05-05 DIAGNOSIS — F419 Anxiety disorder, unspecified: Secondary | ICD-10-CM

## 2022-05-05 NOTE — Progress Notes (Signed)
Diagnosis: Anxiety NOS, F41.9 Session time: 2:00 pm- 2:57 pm CPT code: 24401U-27  Teah was seen using secure video conferencing. She was in her home and the therapist was in her office at the time of the appointment. Session focused on stress related to her marriage. Therapist provided an opportunity to process and suggested communication strategies. Therapist also encouraged Reality to expand her network of social support and explore medical issues that may impact her husband's behavior. She is scheduled to be seen again in two weeks.   Objectives Related Problem: Danniella experiences anxiety related to her son's well-being, as well as difficulty processing emotions relating to current situations and past traumas Description: Analeise will develop strategies to help her regulate and process her emotions, including her anxiety and grief Target Date: 2022-12-08 Frequency: Biweekly Modality: individual Progress: 20% Planned Intervention: Therapist will help Nakoma to identify and disengage from negative thought patterns using CBT-based strategies  Planned Intervention: Therapist will provide Kortlynn with referrals to additional resources as appropriate  Related Problem: Mckayla experiences anxiety related to her son's well-being, as well as difficulty processing emotions relating to current situations and past traumas Description: Uyen will be provided with opportunities to process past traumatic experiences Target Date: 2022-12-08 Frequency: Daily Modality: individual Progress: 30% Planned Intervention: Therapist will provide Royal with opportunities to process her experiences in session   Treatment Plan Client Abilities/Strengths  Ciclaly presented as insightful and motivated to improve her situation.  Client Treatment Preferences  Beena reported that she has found therapy most helpful in the past when she worked with a therapist who confronted and challenged her.  Client Statement of Needs  Talulah  is seeking CBT to help her manage anxiety related to current stressors and past traumas.  Treatment Level  Biweekly  Symptoms  Anxiety: worried thoughts, difficulty relaxing, fears for the future, poor self-image, emerging social anxiety (Status: maintained). Depression: uncontrollable tearfulness, weight gain, emotional eating, depressed mood (Status: maintained).  Problems Addressed  New Description, New Description  Goals 1. Hollee experiences anxiety related to her son's well-being, as well as difficulty processing emotions relating to current situations and past traumas Objective Makylee will be provided with opportunities to process past traumatic experiences Target Date: 2022-12-08 Frequency: Daily  Progress: 30 Modality: individual  Related Interventions Therapist will help Breyona to process her relationships with family members in order to better understand how she may be unwittingly repeating negative patterns from her past Therapist will provide Shataria with opportunities to process her experiences in session Objective Monzerrath will develop strategies to help her regulate and process her emotions, including her anxiety and grief Target Date: 2022-12-08 Frequency: Biweekly  Progress: 20 Modality: individual  Related Interventions Therapist will help Tameisha to identify and assert appropriate boundaries in her relationships, as well as provide her with communication strategies to help her communicate her thoughts and feelings Therapist will provide Calie with referrals to additional resources as appropriate Therapist will provide Satya with strategies to help her regulate her emotions, including deep breathing, meditation, mindfulness, and self-care Therapist will help Jiselle to identify and disengage from negative thought patterns using CBT-based strategies Therapist will engage Maresa in behavior activation, including incorporation of pleasant and mastery events into her daily routine 2.  [name] experiences grief and depressive symptoms related to her father's passing and her challenging relationship with her mother Diagnosis Axis none 300.00 (Anxiety state, unspecified) - Open - [Signifier: n/a]    Conditions For Discharge Achievement of treatment goals and objectives  [name], PhD               [name], PhD               [name], PhD               [name], PhD

## 2022-05-06 DIAGNOSIS — M25512 Pain in left shoulder: Secondary | ICD-10-CM | POA: Diagnosis not present

## 2022-05-06 DIAGNOSIS — M7660 Achilles tendinitis, unspecified leg: Secondary | ICD-10-CM | POA: Diagnosis not present

## 2022-05-09 DIAGNOSIS — M25512 Pain in left shoulder: Secondary | ICD-10-CM | POA: Diagnosis not present

## 2022-05-09 DIAGNOSIS — M7660 Achilles tendinitis, unspecified leg: Secondary | ICD-10-CM | POA: Diagnosis not present

## 2022-05-12 DIAGNOSIS — M25512 Pain in left shoulder: Secondary | ICD-10-CM | POA: Diagnosis not present

## 2022-05-12 DIAGNOSIS — M7660 Achilles tendinitis, unspecified leg: Secondary | ICD-10-CM | POA: Diagnosis not present

## 2022-05-16 DIAGNOSIS — M25512 Pain in left shoulder: Secondary | ICD-10-CM | POA: Diagnosis not present

## 2022-05-16 DIAGNOSIS — M7660 Achilles tendinitis, unspecified leg: Secondary | ICD-10-CM | POA: Diagnosis not present

## 2022-05-19 DIAGNOSIS — M25512 Pain in left shoulder: Secondary | ICD-10-CM | POA: Diagnosis not present

## 2022-05-19 DIAGNOSIS — M7660 Achilles tendinitis, unspecified leg: Secondary | ICD-10-CM | POA: Diagnosis not present

## 2022-05-20 DIAGNOSIS — M5481 Occipital neuralgia: Secondary | ICD-10-CM | POA: Diagnosis not present

## 2022-05-20 DIAGNOSIS — G2589 Other specified extrapyramidal and movement disorders: Secondary | ICD-10-CM | POA: Diagnosis not present

## 2022-05-20 DIAGNOSIS — M898X1 Other specified disorders of bone, shoulder: Secondary | ICD-10-CM | POA: Diagnosis not present

## 2022-05-21 ENCOUNTER — Ambulatory Visit (INDEPENDENT_AMBULATORY_CARE_PROVIDER_SITE_OTHER): Payer: Federal, State, Local not specified - PPO | Admitting: Clinical

## 2022-05-21 DIAGNOSIS — F419 Anxiety disorder, unspecified: Secondary | ICD-10-CM | POA: Diagnosis not present

## 2022-05-21 NOTE — Progress Notes (Signed)
Diagnosis: Anxiety NOS, F41.9 Session time: 9:00 am- 9:57 pm CPT code: 21194R-74  Rachel Ferrell was seen using secure video conferencing. She was in her home and the therapist was in her office at the time of the appointment. Session focused on  continuing to process stress related to her marriage and work. Therapist provided several referrals for marriage counselors, and Rachel Ferrell indicated a plan to look into it. She is scheduled to be seen again in one month.   Objectives Related Problem: Rachel Ferrell experiences anxiety related to her son's well-being, as well as difficulty processing emotions relating to current situations and past traumas Description: Rachel Ferrell will develop strategies to help her regulate and process her emotions, including her anxiety and grief Target Date: 2022-12-08 Frequency: Biweekly Modality: individual Progress: 20% Planned Intervention: Therapist will help Rachel Ferrell to identify and disengage from negative thought patterns using CBT-based strategies  Planned Intervention: Therapist will provide Rachel Ferrell with referrals to additional resources as appropriate  Related Problem: Rachel Ferrell experiences anxiety related to her son's well-being, as well as difficulty processing emotions relating to current situations and past traumas Description: Rachel Ferrell will be provided with opportunities to process past traumatic experiences Target Date: 2022-12-08 Frequency: Daily Modality: individual Progress: 30% Planned Intervention: Therapist will provide Rachel Ferrell with opportunities to process her experiences in session   Treatment Plan Client Abilities/Strengths  Rachel Ferrell presented as insightful and motivated to improve her situation.  Client Treatment Preferences  Rachel Ferrell reported that she has found therapy most helpful in the past when she worked with a therapist who confronted and challenged her.  Client Statement of Needs  Rachel Ferrell is seeking CBT to help her manage anxiety related to current stressors and past  traumas.  Treatment Level  Biweekly  Symptoms  Anxiety: worried thoughts, difficulty relaxing, fears for the future, poor self-image, emerging social anxiety (Status: maintained). Depression: uncontrollable tearfulness, weight gain, emotional eating, depressed mood (Status: maintained).  Problems Addressed  New Description, New Description  Goals 1. Rachel Ferrell experiences anxiety related to her son's well-being, as well as difficulty processing emotions relating to current situations and past traumas Objective Rachel Ferrell will be provided with opportunities to process past traumatic experiences Target Date: 2022-12-08 Frequency: Daily  Progress: 30 Modality: individual  Related Interventions Therapist will help Rachel Ferrell to process her relationships with family members in order to better understand how she may be unwittingly repeating negative patterns from her past Therapist will provide Rachel Ferrell with opportunities to process her experiences in session Objective Rachel Ferrell will develop strategies to help her regulate and process her emotions, including her anxiety and grief Target Date: 2022-12-08 Frequency: Biweekly  Progress: 20 Modality: individual  Related Interventions Therapist will help Rachel Ferrell to identify and assert appropriate boundaries in her relationships, as well as provide her with communication strategies to help her communicate her thoughts and feelings Therapist will provide Rachel Ferrell with referrals to additional resources as appropriate Therapist will provide Rachel Ferrell with strategies to help her regulate her emotions, including deep breathing, meditation, mindfulness, and self-care Therapist will help Rachel Ferrell to identify and disengage from negative thought patterns using CBT-based strategies Therapist will engage Rachel Ferrell in behavior activation, including incorporation of pleasant and mastery events into her daily routine 2. Rachel Ferrell experiences grief and depressive symptoms related to her father's passing and  her challenging relationship with her mother Diagnosis Axis none 300.00 (Anxiety state, unspecified) - Open - [Signifier: n/a]    Conditions For Discharge Achievement of treatment goals and objectives        Rachel Cruise, PhD  Rachel Ferrell Rachel Shivam Mestas, PhD 

## 2022-05-23 DIAGNOSIS — M7660 Achilles tendinitis, unspecified leg: Secondary | ICD-10-CM | POA: Diagnosis not present

## 2022-05-23 DIAGNOSIS — M25512 Pain in left shoulder: Secondary | ICD-10-CM | POA: Diagnosis not present

## 2022-05-27 DIAGNOSIS — M25512 Pain in left shoulder: Secondary | ICD-10-CM | POA: Diagnosis not present

## 2022-05-27 DIAGNOSIS — M7660 Achilles tendinitis, unspecified leg: Secondary | ICD-10-CM | POA: Diagnosis not present

## 2022-06-08 DIAGNOSIS — U099 Post covid-19 condition, unspecified: Secondary | ICD-10-CM | POA: Diagnosis not present

## 2022-06-08 DIAGNOSIS — R0981 Nasal congestion: Secondary | ICD-10-CM | POA: Diagnosis not present

## 2022-06-10 DIAGNOSIS — M25512 Pain in left shoulder: Secondary | ICD-10-CM | POA: Diagnosis not present

## 2022-06-10 DIAGNOSIS — M7660 Achilles tendinitis, unspecified leg: Secondary | ICD-10-CM | POA: Diagnosis not present

## 2022-06-12 ENCOUNTER — Ambulatory Visit: Payer: Federal, State, Local not specified - PPO | Admitting: Clinical

## 2022-06-13 DIAGNOSIS — M25512 Pain in left shoulder: Secondary | ICD-10-CM | POA: Diagnosis not present

## 2022-06-13 DIAGNOSIS — M7660 Achilles tendinitis, unspecified leg: Secondary | ICD-10-CM | POA: Diagnosis not present

## 2022-06-16 DIAGNOSIS — M25512 Pain in left shoulder: Secondary | ICD-10-CM | POA: Diagnosis not present

## 2022-06-16 DIAGNOSIS — M7660 Achilles tendinitis, unspecified leg: Secondary | ICD-10-CM | POA: Diagnosis not present

## 2022-06-17 DIAGNOSIS — M898X1 Other specified disorders of bone, shoulder: Secondary | ICD-10-CM | POA: Diagnosis not present

## 2022-06-17 DIAGNOSIS — G2589 Other specified extrapyramidal and movement disorders: Secondary | ICD-10-CM | POA: Diagnosis not present

## 2022-06-17 DIAGNOSIS — M25812 Other specified joint disorders, left shoulder: Secondary | ICD-10-CM | POA: Diagnosis not present

## 2022-06-18 ENCOUNTER — Encounter: Payer: Self-pay | Admitting: Adult Health

## 2022-06-18 ENCOUNTER — Ambulatory Visit (INDEPENDENT_AMBULATORY_CARE_PROVIDER_SITE_OTHER): Payer: Federal, State, Local not specified - PPO

## 2022-06-18 ENCOUNTER — Ambulatory Visit (INDEPENDENT_AMBULATORY_CARE_PROVIDER_SITE_OTHER): Payer: Federal, State, Local not specified - PPO | Admitting: Adult Health

## 2022-06-18 VITALS — BP 102/80 | HR 76 | Temp 98.0°F | Ht 65.25 in | Wt 166.0 lb

## 2022-06-18 DIAGNOSIS — Z0000 Encounter for general adult medical examination without abnormal findings: Secondary | ICD-10-CM

## 2022-06-18 DIAGNOSIS — J22 Unspecified acute lower respiratory infection: Secondary | ICD-10-CM

## 2022-06-18 DIAGNOSIS — J45998 Other asthma: Secondary | ICD-10-CM

## 2022-06-18 DIAGNOSIS — Z114 Encounter for screening for human immunodeficiency virus [HIV]: Secondary | ICD-10-CM | POA: Diagnosis not present

## 2022-06-18 DIAGNOSIS — Z1159 Encounter for screening for other viral diseases: Secondary | ICD-10-CM

## 2022-06-18 DIAGNOSIS — F909 Attention-deficit hyperactivity disorder, unspecified type: Secondary | ICD-10-CM | POA: Diagnosis not present

## 2022-06-18 DIAGNOSIS — Z23 Encounter for immunization: Secondary | ICD-10-CM | POA: Diagnosis not present

## 2022-06-18 DIAGNOSIS — F411 Generalized anxiety disorder: Secondary | ICD-10-CM | POA: Diagnosis not present

## 2022-06-18 DIAGNOSIS — Z0389 Encounter for observation for other suspected diseases and conditions ruled out: Secondary | ICD-10-CM | POA: Diagnosis not present

## 2022-06-18 LAB — COMPREHENSIVE METABOLIC PANEL
ALT: 14 U/L (ref 0–35)
AST: 15 U/L (ref 0–37)
Albumin: 4.7 g/dL (ref 3.5–5.2)
Alkaline Phosphatase: 58 U/L (ref 39–117)
BUN: 21 mg/dL (ref 6–23)
CO2: 25 mEq/L (ref 19–32)
Calcium: 9.7 mg/dL (ref 8.4–10.5)
Chloride: 101 mEq/L (ref 96–112)
Creatinine, Ser: 0.81 mg/dL (ref 0.40–1.20)
GFR: 90.22 mL/min (ref >60.00)
Glucose, Bld: 83 mg/dL (ref 70–99)
Potassium: 4 mEq/L (ref 3.5–5.1)
Sodium: 138 mEq/L (ref 135–145)
Total Bilirubin: 1.3 mg/dL — ABNORMAL HIGH (ref 0.2–1.2)
Total Protein: 7.8 g/dL (ref 6.0–8.3)

## 2022-06-18 LAB — CBC WITH DIFFERENTIAL/PLATELET
Basophils Absolute: 0.1 10*3/uL (ref 0.0–0.1)
Basophils Relative: 1.2 % (ref 0.0–3.0)
Eosinophils Absolute: 0.2 10*3/uL (ref 0.0–0.7)
Eosinophils Relative: 2.9 % (ref 0.0–5.0)
HCT: 45.4 % (ref 36.0–46.0)
Hemoglobin: 15.2 g/dL — ABNORMAL HIGH (ref 12.0–15.0)
Lymphocytes Relative: 28.3 % (ref 12.0–46.0)
Lymphs Abs: 2.2 10*3/uL (ref 0.7–4.0)
MCHC: 33.5 g/dL (ref 30.0–36.0)
MCV: 89.4 fl (ref 78.0–100.0)
Monocytes Absolute: 0.6 10*3/uL (ref 0.1–1.0)
Monocytes Relative: 7.6 % (ref 3.0–12.0)
Neutro Abs: 4.8 10*3/uL (ref 1.4–7.7)
Neutrophils Relative %: 60 % (ref 43.0–77.0)
Platelets: 338 10*3/uL (ref 150.0–400.0)
RBC: 5.08 Mil/uL (ref 3.87–5.11)
RDW: 13.7 % (ref 11.5–15.5)
WBC: 7.9 10*3/uL (ref 4.0–10.5)

## 2022-06-18 LAB — LIPID PANEL
Cholesterol: 219 mg/dL — ABNORMAL HIGH (ref 0–200)
HDL: 72 mg/dL
LDL Cholesterol: 124 mg/dL — ABNORMAL HIGH (ref 0–99)
NonHDL: 146.86
Total CHOL/HDL Ratio: 3
Triglycerides: 112 mg/dL (ref 0.0–149.0)
VLDL: 22.4 mg/dL (ref 0.0–40.0)

## 2022-06-18 LAB — HEMOGLOBIN A1C: Hgb A1c MFr Bld: 5.8 % (ref 4.6–6.5)

## 2022-06-18 LAB — TSH: TSH: 1.4 u[IU]/mL (ref 0.35–5.50)

## 2022-06-18 MED ORDER — AMPHETAMINE-DEXTROAMPHET ER 10 MG PO CP24: 10.0000 mg | ORAL_CAPSULE | Freq: Every day | ORAL | 0 refills | Status: DC

## 2022-06-18 NOTE — Addendum Note (Signed)
Addended by: Gwenyth Ober R on: 06/18/2022 09:08 AM   Modules accepted: Orders

## 2022-06-18 NOTE — Progress Notes (Signed)
Subjective:    Patient ID: Rachel Ferrell, female    DOB: 12-Mar-1981, 42 y.o.   MRN: [id number]  HPI  Patient presents for yearly preventative medicine examination. She is a pleasant 42 year old female who  has a past medical history of ADHD, Asthma, Chronic sinusitis, Diverticulosis, GERD (gastroesophageal reflux disease), and Migraine.  Generalized Anxiety Disorder -seen by Dr. Gaynell Face.  She feels as though her symptoms are well-controlled for the most part  Asthma-managed by allergy and asthma with albuterol rescue inhaler Spiriva 1.25 mcg and Singulair 10 mg nightly.  Her symptoms have been well-controlled  ADHD-was diagnosed by Dr. Mallie Mussel.  On her last visit roughly 2 months ago she was trialed on low-dose Adderall 10 mg extended release daily. She reports that she responded well to Adderall. She would like to continue the medication. She denies anorexia, palpitations, or insomnia.   Acute Issue  - [facility] For respiratory complaints. She was prescribed a z pack and steroid taper for suspected pneumonia ( unable to do chest xray at that time as the machine was down). She finished her medication therapy but continues to have wheezing and productive cough. No fevers or chills.   All immunizations and health maintenance protocols were reviewed with the patient and needed orders were placed.  Appropriate screening laboratory values were ordered for the patient including screening of hyperlipidemia, renal function and hepatic function. If indicated by BPH, a PSA was ordered.  Medication reconciliation,  past medical history, social history, problem list and allergies were reviewed in detail with the patient  Goals were established with regard to weight loss, exercise, and  diet in compliance with medications. She is walking about 30 minutes daily on treadmill  and has recently started doing strength training. Her diet fluctuates  from eating healthy to not eating healthy depending on mental health.   Wt Readings from Last 3 Encounters:  06/18/22 166 lb (75.3 kg)  04/23/22 160 lb (72.6 kg)  02/12/22 164 lb (74.4 kg)   She is up to date on routine GYN care and mammograms.    Review of Systems  Constitutional: Negative.   HENT: Negative.    Eyes: Negative.   Respiratory:  Positive for cough and wheezing.   Cardiovascular: Negative.   Gastrointestinal: Negative.   Endocrine: Negative.   Genitourinary: Negative.   Musculoskeletal: Negative.   Skin: Negative.   Allergic/Immunologic: Negative.   Neurological: Negative.   Hematological: Negative.   Psychiatric/Behavioral: Negative.     Past Medical History:  Diagnosis Date   ADHD    Asthma    Chronic sinusitis    Diverticulosis    GERD (gastroesophageal reflux disease)    Migraine     Social History   Socioeconomic History   Marital status: Married    Spouse name: Not on file   Number of children: 2   Years of education: Not on file   Highest education level: Not on file  Occupational History    Employer: [employer]    Comment: Audits medical claims  Tobacco Use   Smoking status: Never   Smokeless tobacco: Never  Vaping Use   Vaping Use: Never used  Substance and Sexual Activity   Alcohol use: No   Drug use: No   Sexual activity: Yes    Birth control/protection: None  Other Topics Concern   Not on file  Social History Narrative   Married  Two children 8 and 33 years old.    Social Determinants of Health   Financial Resource Strain: Not on file  Food Insecurity: Not on file  Transportation Needs: Not on file  Physical Activity: Not on file  Stress: Not on file  Social Connections: Not on file  Intimate Partner Violence: Not on file    Past Surgical History:  Procedure Laterality Date   ABLATION     2017- uterine to reduce periods   NASAL SEPTUM SURGERY     2012   REFRACTIVE SURGERY Bilateral    12/2021   SINUS  IRRIGATION      Family History  Problem Relation Age of Onset   Thyroid disease Mother    Hypertension Father    Dementia Father    Thyroid disease Father    Drug abuse Brother    Depression Brother    Alcohol abuse Brother    ADD / ADHD Son    Vision loss Maternal Grandmother    Dementia Maternal Grandmother    Dementia Paternal Grandmother    Cancer - Colon Neg Hx    Stomach cancer Neg Hx    Esophageal cancer Neg Hx    Pancreatic cancer Neg Hx    Colon cancer Neg Hx    Rectal cancer Neg Hx     No Known Allergies  Current Outpatient Medications on File Prior to Visit  Medication Sig Dispense Refill   albuterol (PROVENTIL HFA;VENTOLIN HFA) 108 (90 BASE) MCG/ACT inhaler Inhale 2 puffs into the lungs every 6 (six) hours as needed for wheezing.     amphetamine-dextroamphetamine (ADDERALL XR) 10 MG 24 hr capsule Take 1 capsule (10 mg total) by mouth daily. 30 capsule 0   Cholecalciferol (VITAMIN D) 2000 units tablet Take 2,000 Units by mouth See admin instructions. Take 1 tablet (2000 units) by mouth daily during winter months and 1 tablet (2000 units) every other day during summer months     fluticasone (FLONASE) 50 MCG/ACT nasal spray Place 1 spray into both nostrils at bedtime.      fluticasone furoate-vilanterol (BREO ELLIPTA) 200-25 MCG/INH AEPB Inhale 1 puff into the lungs daily.     montelukast (SINGULAIR) 10 MG tablet Take 10 mg by mouth at bedtime.     Multiple Vitamin (MULTIVITAMIN WITH MINERALS) TABS tablet Take 2 tablets by mouth 2 (two) times daily. [employer] brand     Tiotropium Bromide Monohydrate (SPIRIVA RESPIMAT) 1.25 MCG/ACT AERS Take 1.25 mg by mouth daily. Inhale 2 puffs by mouth daily     No current facility-administered medications on file prior to visit.    BP 102/80   Pulse 76   Temp 98 F (36.7 C) (Oral)   Ht 5' 5.25" (1.657 m)   Wt 166 lb (75.3 kg)   SpO2 96%   BMI 27.41 kg/m       Objective:   Physical Exam Vitals and nursing note  reviewed.  Constitutional:      General: She is not in acute distress.    Appearance: Normal appearance. She is well-developed. She is not ill-appearing.  HENT:     Head: Normocephalic and atraumatic.     Right Ear: Tympanic membrane, ear canal and external ear normal. There is no impacted cerumen.     Left Ear: Tympanic membrane, ear canal and external ear normal. There is no impacted cerumen.     Nose: Nose normal. No congestion or rhinorrhea.     Mouth/Throat:     Mouth: Mucous membranes  are moist.     Pharynx: Oropharynx is clear. No oropharyngeal exudate or posterior oropharyngeal erythema.  Eyes:     General:        Right eye: No discharge.        Left eye: No discharge.     Extraocular Movements: Extraocular movements intact.     Conjunctiva/sclera: Conjunctivae normal.     Pupils: Pupils are equal, round, and reactive to light.  Neck:     Thyroid: No thyromegaly.     Vascular: No carotid bruit.     Trachea: No tracheal deviation.  Cardiovascular:     Rate and Rhythm: Normal rate and regular rhythm.     Pulses: Normal pulses.     Heart sounds: Normal heart sounds. No murmur heard.    No friction rub. No gallop.  Pulmonary:     Effort: Pulmonary effort is normal. No respiratory distress.     Breath sounds: No stridor. Examination of the right-middle field reveals rhonchi. Examination of the right-lower field reveals rhonchi. Rhonchi present. No wheezing or rales.  Chest:     Chest wall: No tenderness.  Abdominal:     General: Abdomen is flat. Bowel sounds are normal. There is no distension.     Palpations: Abdomen is soft. There is no mass.     Tenderness: There is no abdominal tenderness. There is no right CVA tenderness, left CVA tenderness, guarding or rebound.     Hernia: No hernia is present.  Musculoskeletal:        General: No swelling, tenderness, deformity or signs of injury. Normal range of motion.     Cervical back: Normal range of motion and neck supple.      Right lower leg: No edema.     Left lower leg: No edema.  Lymphadenopathy:     Cervical: No cervical adenopathy.  Skin:    General: Skin is warm and dry.     Coloration: Skin is not jaundiced or pale.     Findings: No bruising, erythema, lesion or rash.  Neurological:     General: No focal deficit present.     Mental Status: She is alert and oriented to person, place, and time.     Cranial Nerves: No cranial nerve deficit.     Sensory: No sensory deficit.     Motor: No weakness.     Coordination: Coordination normal.     Gait: Gait normal.     Deep Tendon Reflexes: Reflexes normal.  Psychiatric:        Mood and Affect: Mood normal.        Behavior: Behavior normal.        Thought Content: Thought content normal.        Judgment: Judgment normal.       Assessment & Plan:  1. Routine general medical examination at a health care facility Today patient counseled on age appropriate routine health concerns for screening and prevention, each reviewed and up to date or declined. Immunizations reviewed and up to date or declined. Labs ordered and reviewed. Risk factors for depression reviewed and negative. Hearing function and visual acuity are intact. ADLs screened and addressed as needed. Functional ability and level of safety reviewed and appropriate. Education, counseling and referrals performed based on assessed risks today. Patient provided with a copy of personalized plan for preventive services. - Boostrix given today   2. Attention deficit hyperactivity disorder (ADHD), unspecified ADHD type  - CBC with Differential/Platelet; Future - Comprehensive metabolic panel;  Future - Hemoglobin A1c; Future - Lipid panel; Future - TSH; Future - amphetamine-dextroamphetamine (ADDERALL XR) 10 MG 24 hr capsule; Take 1 capsule (10 mg total) by mouth daily.  Dispense: 30 capsule; Refill: 0 - amphetamine-dextroamphetamine (ADDERALL XR) 10 MG 24 hr capsule; Take 1 capsule (10 mg total) by mouth  daily.  Dispense: 30 capsule; Refill: 0 - amphetamine-dextroamphetamine (ADDERALL XR) 10 MG 24 hr capsule; Take 1 capsule (10 mg total) by mouth daily.  Dispense: 30 capsule; Refill: 0  3. Asthma, persistent, severity to be determined - Per allergy and asthma   4. Generalized anxiety disorder - Follow up with Behavioral health - CBC with Differential/Platelet; Future - Comprehensive metabolic panel; Future - Hemoglobin A1c; Future - Lipid panel; Future - TSH; Future  5. Need for hepatitis C screening test  - Hep C Antibody; Future  6. Encounter for screening for HIV - HIV Antibody (routine testing w rflx); Future  7. Lower respiratory infection - DG Chest 2 View; Future - Consider abx therapy   Dorothyann Peng, NP

## 2022-06-18 NOTE — Patient Instructions (Signed)
It was great seeing you today   We will follow up with you regarding your lab work   Please let me know if you need anything   

## 2022-06-19 LAB — HEPATITIS C ANTIBODY: Hepatitis C Ab: NONREACTIVE

## 2022-06-19 LAB — HIV ANTIBODY (ROUTINE TESTING W REFLEX): HIV 1&2 Ab, 4th Generation: NONREACTIVE

## 2022-06-24 ENCOUNTER — Emergency Department (HOSPITAL_BASED_OUTPATIENT_CLINIC_OR_DEPARTMENT_OTHER): Payer: Federal, State, Local not specified - PPO

## 2022-06-24 ENCOUNTER — Other Ambulatory Visit: Payer: Self-pay

## 2022-06-24 ENCOUNTER — Emergency Department (HOSPITAL_BASED_OUTPATIENT_CLINIC_OR_DEPARTMENT_OTHER)
Admission: EM | Admit: 2022-06-24 | Discharge: 2022-06-24 | Disposition: A | Discharge status: Home / Self Care | Payer: Federal, State, Local not specified - PPO | Attending: Emergency Medicine | Admitting: Emergency Medicine

## 2022-06-24 ENCOUNTER — Encounter (HOSPITAL_BASED_OUTPATIENT_CLINIC_OR_DEPARTMENT_OTHER): Payer: Self-pay | Admitting: Emergency Medicine

## 2022-06-24 DIAGNOSIS — D72829 Elevated white blood cell count, unspecified: Secondary | ICD-10-CM | POA: Diagnosis not present

## 2022-06-24 DIAGNOSIS — Z1152 Encounter for screening for COVID-19: Secondary | ICD-10-CM | POA: Insufficient documentation

## 2022-06-24 DIAGNOSIS — K5792 Diverticulitis of intestine, part unspecified, without perforation or abscess without bleeding: Secondary | ICD-10-CM

## 2022-06-24 DIAGNOSIS — R1032 Left lower quadrant pain: Secondary | ICD-10-CM | POA: Diagnosis not present

## 2022-06-24 DIAGNOSIS — R109 Unspecified abdominal pain: Secondary | ICD-10-CM | POA: Diagnosis not present

## 2022-06-24 LAB — URINALYSIS, ROUTINE W REFLEX MICROSCOPIC
Bilirubin Urine: NEGATIVE
Glucose, UA: NEGATIVE mg/dL
Ketones, ur: 15 mg/dL — AB
Nitrite: NEGATIVE
Specific Gravity, Urine: 1.027 (ref 1.005–1.030)
pH: 5 (ref 5.0–8.0)

## 2022-06-24 LAB — CBC WITH DIFFERENTIAL/PLATELET
Abs Immature Granulocytes: 0.04 10*3/uL (ref 0.00–0.07)
Basophils Absolute: 0.1 10*3/uL (ref 0.0–0.1)
Basophils Relative: 0 %
Eosinophils Absolute: 0.3 10*3/uL (ref 0.0–0.5)
Eosinophils Relative: 2 %
HCT: 39.9 % (ref 36.0–46.0)
Hemoglobin: 13.5 g/dL (ref 12.0–15.0)
Immature Granulocytes: 0 %
Lymphocytes Relative: 12 %
Lymphs Abs: 1.6 10*3/uL (ref 0.7–4.0)
MCH: 29.9 pg (ref 26.0–34.0)
MCHC: 33.8 g/dL (ref 30.0–36.0)
MCV: 88.3 fL (ref 80.0–100.0)
Monocytes Absolute: 0.9 10*3/uL (ref 0.1–1.0)
Monocytes Relative: 7 %
Neutro Abs: 11 10*3/uL — ABNORMAL HIGH (ref 1.7–7.7)
Neutrophils Relative %: 79 %
Platelets: 238 10*3/uL (ref 150–400)
RBC: 4.52 MIL/uL (ref 3.87–5.11)
RDW: 13.9 % (ref 11.5–15.5)
WBC: 13.9 10*3/uL — ABNORMAL HIGH (ref 4.0–10.5)
nRBC: 0 % (ref 0.0–0.2)

## 2022-06-24 LAB — BASIC METABOLIC PANEL
Anion gap: 10 (ref 5–15)
BUN: 17 mg/dL (ref 6–20)
CO2: 22 mmol/L (ref 22–32)
Calcium: 9.4 mg/dL (ref 8.9–10.3)
Chloride: 103 mmol/L (ref 98–111)
Creatinine, Ser: 0.69 mg/dL (ref 0.44–1.00)
GFR, Estimated: >60 mL/min (ref >60)
Glucose, Bld: 100 mg/dL — ABNORMAL HIGH (ref 70–99)
Potassium: 3.6 mmol/L (ref 3.5–5.1)
Sodium: 135 mmol/L (ref 135–145)

## 2022-06-24 LAB — RESP PANEL BY RT-PCR (RSV, FLU A&B, COVID)  RVPGX2
Influenza A by PCR: NEGATIVE
Influenza B by PCR: NEGATIVE
Resp Syncytial Virus by PCR: NEGATIVE
SARS Coronavirus 2 by RT PCR: NEGATIVE

## 2022-06-24 LAB — HCG, SERUM, QUALITATIVE: Preg, Serum: NEGATIVE

## 2022-06-24 MED ORDER — IOHEXOL 300 MG/ML  SOLN
100.0000 mL | Freq: Once | INTRAMUSCULAR | Status: AC | PRN
  Administered 2022-06-24: 75 mL via INTRAVENOUS

## 2022-06-24 MED ORDER — ONDANSETRON HCL 4 MG/2ML IJ SOLN
4.0000 mg | Freq: Once | INTRAMUSCULAR | Status: AC
  Administered 2022-06-24: 4 mg via INTRAVENOUS
  Filled 2022-06-24: qty 2

## 2022-06-24 MED ORDER — ONDANSETRON 4 MG PO TBDP: ORAL_TABLET | ORAL | 0 refills | Status: DC

## 2022-06-24 MED ORDER — SODIUM CHLORIDE 0.9 % IV BOLUS
500.0000 mL | Freq: Once | INTRAVENOUS | Status: AC
  Administered 2022-06-24: 500 mL via INTRAVENOUS

## 2022-06-24 MED ORDER — AMOXICILLIN-POT CLAVULANATE 875-125 MG PO TABS: 1.0000 | ORAL_TABLET | Freq: Two times a day (BID) | ORAL | 0 refills | Status: DC

## 2022-06-24 MED ORDER — MORPHINE SULFATE 15 MG PO TABS: 7.5000 mg | ORAL_TABLET | ORAL | 0 refills | Status: DC | PRN

## 2022-06-24 MED ORDER — FENTANYL CITRATE PF 50 MCG/ML IJ SOSY
50.0000 ug | PREFILLED_SYRINGE | Freq: Once | INTRAMUSCULAR | Status: AC
  Administered 2022-06-24: 50 ug via INTRAVENOUS
  Filled 2022-06-24: qty 1

## 2022-06-24 NOTE — ED Notes (Signed)
Pt given discharge instructions and reviewed prescriptions. Opportunities given for questions. Pt verbalizes understanding. Leanne Chang, RN

## 2022-06-24 NOTE — Discharge Instructions (Addendum)
Follow up with your PCP in the office.  Follow up with GI.  Take 4 over the counter ibuprofen tablets 3 times a day or 2 over-the-counter naproxen tablets twice a day for pain. Also take tylenol '1000mg'$ (2 extra strength) four times a day.   Then take the pain medicine if you feel like you need it. Narcotics do not help with the pain, they only make you care about it less.  You can become addicted to this, people may break into your house to steal it.  It will constipate you.  If you drive under the influence of this medicine you can get a DUI.

## 2022-06-24 NOTE — ED Provider Notes (Signed)
Received patient in turnover from Dr. Randal Buba.  Please see their note for further details of Hx, PE.  Briefly patient is a 42 y.o. female with a Abdominal Pain .  Has a history of diverticulitis having diarrhea and lower abdominal discomfort.  Found to have a leukocytosis plan for CT imaging.  CT scan is resulted with acute diverticulitis without complication.  Patient's pain is well-controlled tolerating p.o.  Will start on antibiotics PCP follow-up.Tyrone Nine, Linna Hoff, DO 06/24/22 [phone]

## 2022-06-24 NOTE — ED Provider Notes (Signed)
[facility] EMERGENCY DEPT Provider Note   CSN: [id number] Arrival date & time: 06/24/22  2683     History  Chief Complaint  Patient presents with   Abdominal Pain    Rachel Ferrell is a 42 y.o. female.  The history is provided by the patient.  Abdominal Pain Pain location:  LLQ Pain quality: aching   Pain radiates to:  Does not radiate Pain severity:  Severe Duration:  18 hours Timing:  Constant Progression:  Unchanged Chronicity:  Recurrent Context: not trauma   Relieved by:  Nothing Worsened by:  Nothing Associated symptoms: diarrhea   Associated symptoms: no cough, no dysuria, no fever and no vomiting   Risk factors: no recent hospitalization   Patient with a h/o diverticulitis presents with LLQ pain and diarrhea x 18 hours.  No fevers,  no vomiting.  No dysuria.  No URI symptoms.       Home Medications Prior to Admission medications   Medication Sig Start Date End Date Taking? Authorizing Provider  albuterol (PROVENTIL HFA;VENTOLIN HFA) 108 (90 BASE) MCG/ACT inhaler Inhale 2 puffs into the lungs every 6 (six) hours as needed for wheezing.    [provider]  amphetamine-dextroamphetamine (ADDERALL XR) 10 MG 24 hr capsule Take 1 capsule (10 mg total) by mouth daily. 06/18/22   Nafziger, Tommi Rumps, NP  amphetamine-dextroamphetamine (ADDERALL XR) 10 MG 24 hr capsule Take 1 capsule (10 mg total) by mouth daily. 06/18/22 07/18/22  Nafziger, Tommi Rumps, NP  amphetamine-dextroamphetamine (ADDERALL XR) 10 MG 24 hr capsule Take 1 capsule (10 mg total) by mouth daily. 06/18/22   Nafziger, Tommi Rumps, NP  Cholecalciferol (VITAMIN D) 2000 units tablet Take 2,000 Units by mouth See admin instructions. Take 1 tablet (2000 units) by mouth daily during winter months and 1 tablet (2000 units) every other day during summer months    [provider]  fluticasone (FLONASE) 50 MCG/ACT nasal spray Place 1 spray into both nostrils at bedtime.     [provider]   fluticasone furoate-vilanterol (BREO ELLIPTA) 200-25 MCG/INH AEPB Inhale 1 puff into the lungs daily.    [provider]  montelukast (SINGULAIR) 10 MG tablet Take 10 mg by mouth at bedtime.    [provider]  Multiple Vitamin (MULTIVITAMIN WITH MINERALS) TABS tablet Take 2 tablets by mouth 2 (two) times daily. [employer] brand    [provider]  Tiotropium Bromide Monohydrate (SPIRIVA RESPIMAT) 1.25 MCG/ACT AERS Take 1.25 mg by mouth daily. Inhale 2 puffs by mouth daily 01/22/18   [provider]      Allergies    Patient has no known allergies.    Review of Systems   Review of Systems  Constitutional:  Negative for fever.  HENT:  Negative for facial swelling.   Eyes:  Negative for redness.  Respiratory:  Negative for cough.   Gastrointestinal:  Positive for abdominal pain and diarrhea. Negative for vomiting.  Genitourinary:  Negative for dysuria.  All other systems reviewed and are negative.   Physical Exam Updated Vital Signs BP (!) 140/81 (BP Location: Right Arm)   Pulse 96   Temp 98.1 F (36.7 C) (Oral)   Resp 19   Ht '5\' 5"'$  (1.651 m)   Wt 74.8 kg   LMP 06/04/2022 (Exact Date)   SpO2 99%   BMI 27.46 kg/m  Physical Exam Vitals and nursing note reviewed. Exam conducted with a chaperone present.  Constitutional:      General: She is not in acute distress.  Appearance: Normal appearance. She is well-developed.  HENT:     Head: Normocephalic and atraumatic.     Nose: Nose normal.  Eyes:     Pupils: Pupils are equal, round, and reactive to light.  Cardiovascular:     Rate and Rhythm: Normal rate and regular rhythm.     Pulses: Normal pulses.     Heart sounds: Normal heart sounds.  Pulmonary:     Effort: Pulmonary effort is normal. No respiratory distress.     Breath sounds: Normal breath sounds.  Abdominal:     General: Bowel sounds are normal. There is no distension.     Palpations: Abdomen is soft. There is no mass.      Tenderness: There is no abdominal tenderness. There is no guarding or rebound.     Hernia: No hernia is present.  Genitourinary:    Vagina: No vaginal discharge.  Musculoskeletal:        General: Normal range of motion.     Cervical back: Neck supple.  Skin:    General: Skin is warm and dry.     Capillary Refill: Capillary refill takes less than 2 seconds.     Findings: No erythema or rash.  Neurological:     General: No focal deficit present.     Mental Status: She is alert.     Deep Tendon Reflexes: Reflexes normal.  Psychiatric:        Thought Content: Thought content normal.     ED Results / Procedures / Treatments   Labs (all labs ordered are listed, but only abnormal results are displayed) Results for orders placed or performed during the hospital encounter of 06/24/22  CBC with Differential  Result Value Ref Range   WBC 13.9 (H) 4.0 - 10.5 K/uL   RBC 4.52 3.87 - 5.11 MIL/uL   Hemoglobin 13.5 12.0 - 15.0 g/dL   HCT 39.9 36.0 - 46.0 %   MCV 88.3 80.0 - 100.0 fL   MCH 29.9 26.0 - 34.0 pg   MCHC 33.8 30.0 - 36.0 g/dL   RDW 13.9 11.5 - 15.5 %   Platelets 238 150 - 400 K/uL   nRBC 0.0 0.0 - 0.2 %   Neutrophils Relative % 79 %   Neutro Abs 11.0 (H) 1.7 - 7.7 K/uL   Lymphocytes Relative 12 %   Lymphs Abs 1.6 0.7 - 4.0 K/uL   Monocytes Relative 7 %   Monocytes Absolute 0.9 0.1 - 1.0 K/uL   Eosinophils Relative 2 %   Eosinophils Absolute 0.3 0.0 - 0.5 K/uL   Basophils Relative 0 %   Basophils Absolute 0.1 0.0 - 0.1 K/uL   Immature Granulocytes 0 %   Abs Immature Granulocytes 0.04 0.00 - 0.07 K/uL  Basic metabolic panel  Result Value Ref Range   Sodium 135 135 - 145 mmol/L   Potassium 3.6 3.5 - 5.1 mmol/L   Chloride 103 98 - 111 mmol/L   CO2 22 22 - 32 mmol/L   Glucose, Bld 100 (H) 70 - 99 mg/dL   BUN 17 6 - 20 mg/dL   Creatinine, Ser 0.69 0.44 - 1.00 mg/dL   Calcium 9.4 8.9 - 10.3 mg/dL   GFR, Estimated >60 >60 mL/min   Anion gap 10 5 - 15  Urinalysis, Routine  w reflex microscopic Urine, Clean Catch  Result Value Ref Range   Color, Urine YELLOW YELLOW   APPearance HAZY (A) CLEAR   Specific Gravity, Urine 1.027 1.005 - 1.030   pH  5.0 5.0 - 8.0   Glucose, UA NEGATIVE NEGATIVE mg/dL   Hgb urine dipstick SMALL (A) NEGATIVE   Bilirubin Urine NEGATIVE NEGATIVE   Ketones, ur 15 (A) NEGATIVE mg/dL   Protein, ur TRACE (A) NEGATIVE mg/dL   Nitrite NEGATIVE NEGATIVE   Leukocytes,Ua SMALL (A) NEGATIVE   RBC / HPF 0-5 0 - 5 RBC/hpf   WBC, UA 11-20 0 - 5 WBC/hpf   Bacteria, UA RARE (A) NONE SEEN   Squamous Epithelial / HPF 11-20 0 - 5 /HPF   Mucus PRESENT    Hyaline Casts, UA PRESENT   hCG, serum, qualitative  Result Value Ref Range   Preg, Serum NEGATIVE NEGATIVE   DG Chest 2 View  Result Date: 06/18/2022 CLINICAL DATA:  42 year old female with clinical concern for pneumonia. EXAM: CHEST - 2 VIEW COMPARISON:  Chest x-ray 12/28/2015. FINDINGS: Lung volumes are normal. No consolidative airspace disease. No pleural effusions. No pneumothorax. No pulmonary nodule or mass noted. Pulmonary vasculature and the cardiomediastinal silhouette are within normal limits. IMPRESSION: No radiographic evidence of acute cardiopulmonary disease. Electronically Signed   By: Vinnie Langton M.D.   On: 06/18/2022 15:42     Radiology No results found.  Procedures Procedures    Medications Ordered in ED Medications  sodium chloride 0.9 % bolus 500 mL (500 mLs Intravenous New Bag/Given 06/24/22 0613)  ondansetron [facility]) injection 4 mg (4 mg Intravenous Given 06/24/22 0614)  fentaNYL (SUBLIMAZE) injection 50 mcg (50 mcg Intravenous Given 06/24/22 4628)    ED Course/ Medical Decision Making/ A&P                             Medical Decision Making Patient with a h/o diverticulitis presents with diarrhea and LLQ pain   Amount and/or Complexity of Data Reviewed External Data Reviewed: notes.    Details: Previous notes reviewed  Labs: ordered.    Details: All  labs reviewed:  pregnancy negative, urine negative for UTI. White count slight elevation 13.9, normal hemoglobin 13.5, normal platelet count.  Normal sodium 135, normal potassium 3.6, normal creatinine .69.   Radiology: ordered.  Risk Prescription drug management. Parenteral controlled substances. Risk Details: IV fluids and pain medication ordered for patient.  Labs and imaging ordered.  Awaiting testing and reassessment     Final Clinical Impression(s) / ED Diagnoses Final diagnoses:  None   Signed out to Dr. Tyrone Nine pending CT and reassessment  Rx / DC Orders ED Discharge Orders     None         Vihan Santagata, MD 06/24/22 [phone]

## 2022-06-24 NOTE — ED Triage Notes (Signed)
Pt presents to ED with LLQ abd pain and diarrhea, urinary frequency, hx of diverticulitis, EDP at bedside

## 2022-06-25 ENCOUNTER — Encounter: Payer: Self-pay | Admitting: Gastroenterology

## 2022-06-25 ENCOUNTER — Encounter: Payer: Self-pay | Admitting: Adult Health

## 2022-06-27 NOTE — Telephone Encounter (Signed)
Called pt to f/u on message. Pt stated that she does not have any questions or concerns.

## 2022-07-17 ENCOUNTER — Ambulatory Visit: Payer: Federal, State, Local not specified - PPO | Admitting: Clinical

## 2022-07-17 DIAGNOSIS — F419 Anxiety disorder, unspecified: Secondary | ICD-10-CM

## 2022-07-17 NOTE — Progress Notes (Signed)
Diagnosis: Anxiety NOS, F41.9 Session time: 12:00 pm- 12:57 pm CPT code: [id number]  Nastacia was seen in person for individual therapy. Session focused on issues that had arisen in her marriage since her last session. Therapist offered validation and support, as well as suggested communication strategies and worked with [name] to consider how she might approach a conversation with her husband. She is scheduled to be seen again in one month.   Objectives Related Problem: Rupal experiences anxiety related to her son's well-being, as well as difficulty processing emotions relating to current situations and past traumas Description: Brietta will develop strategies to help her regulate and process her emotions, including her anxiety and grief Target Date: 2022-12-08 Frequency: Biweekly Modality: individual Progress: 20% Planned Intervention: Therapist will help Ciarra to identify and disengage from negative thought patterns using CBT-based strategies  Planned Intervention: Therapist will provide Iyah with referrals to additional resources as appropriate  Related Problem: [name] experiences anxiety related to her son's well-being, as well as difficulty processing emotions relating to current situations and past traumas Description: Kitana will be provided with opportunities to process past traumatic experiences Target Date: 2022-12-08 Frequency: Daily Modality: individual Progress: 30% Planned Intervention: Therapist will provide Camillia with opportunities to process her experiences in session   Treatment Plan Client Abilities/Strengths  Shailene presented as insightful and motivated to improve her situation.  Client Treatment Preferences  Joann reported that she has found therapy most helpful in the past when she worked with a therapist who confronted and challenged her.  Client Statement of Needs  Jalaya is seeking CBT to help her manage anxiety related to current stressors and past traumas.  Treatment  Level  Biweekly  Symptoms  Anxiety: worried thoughts, difficulty relaxing, fears for the future, poor self-image, emerging social anxiety (Status: maintained). Depression: uncontrollable tearfulness, weight gain, emotional eating, depressed mood (Status: maintained).  Problems Addressed  New Description, New Description  Goals 1. Aleksis experiences anxiety related to her son's well-being, as well as difficulty processing emotions relating to current situations and past traumas Objective Copper will be provided with opportunities to process past traumatic experiences Target Date: 2022-12-08 Frequency: Daily  Progress: 30 Modality: individual  Related Interventions Therapist will help Naje to process her relationships with family members in order to better understand how she may be unwittingly repeating negative patterns from her past Therapist will provide Emrey with opportunities to process her experiences in session Objective Jaala will develop strategies to help her regulate and process her emotions, including her anxiety and grief Target Date: 2022-12-08 Frequency: Biweekly  Progress: 20 Modality: individual  Related Interventions Therapist will help Alegra to identify and assert appropriate boundaries in her relationships, as well as provide her with communication strategies to help her communicate her thoughts and feelings Therapist will provide Bera with referrals to additional resources as appropriate Therapist will provide Lorielle with strategies to help her regulate her emotions, including deep breathing, meditation, mindfulness, and self-care Therapist will help Royale to identify and disengage from negative thought patterns using CBT-based strategies Therapist will engage Alycen in behavior activation, including incorporation of pleasant and mastery events into her daily routine 2. [name] experiences grief and depressive symptoms related to her father's passing and her challenging  relationship with her mother Diagnosis Axis none 300.00 (Anxiety state, unspecified) - Open - [Signifier: n/a]    Conditions For Discharge Achievement of treatment goals and objectives                [name], PhD  Burr Soffer L Brit Wernette, PhD 

## 2022-07-29 ENCOUNTER — Ambulatory Visit: Payer: Federal, State, Local not specified - PPO | Admitting: Clinical

## 2022-07-29 DIAGNOSIS — F419 Anxiety disorder, unspecified: Secondary | ICD-10-CM | POA: Diagnosis not present

## 2022-07-29 NOTE — Progress Notes (Signed)
Diagnosis: Anxiety NOS, F41.9 Session time: 1:00 pm- 1:57 pm CPT code: [id number]  Rachel Ferrell was seen in person for individual therapy. Session focused on continuing to work through issues that had arisen in her marriage. Therapist suggested communication strategies and worked with Rachel Ferrell to process different possible outcomes. She is scheduled to be seen again in one month.   Objectives Related Problem: Rachel Ferrell experiences anxiety related to her son's well-being, as well as difficulty processing emotions relating to current situations and past traumas Description: Rachel Ferrell will develop strategies to help her regulate and process her emotions, including her anxiety and grief Target Date: 2022-12-08 Frequency: Biweekly Modality: individual Progress: 20% Planned Intervention: Therapist will help Rachel Ferrell to identify and disengage from negative thought patterns using CBT-based strategies  Planned Intervention: Therapist will provide Rachel Ferrell with referrals to additional resources as appropriate  Related Problem: Rachel Ferrell experiences anxiety related to her son's well-being, as well as difficulty processing emotions relating to current situations and past traumas Description: Rachel Ferrell will be provided with opportunities to process past traumatic experiences Target Date: 2022-12-08 Frequency: Daily Modality: individual Progress: 30% Planned Intervention: Therapist will provide Rachel Ferrell with opportunities to process her experiences in session   Treatment Plan Client Abilities/Strengths  Rachel Ferrell presented as insightful and motivated to improve her situation.  Client Treatment Preferences  Rachel Ferrell reported that she has found therapy most helpful in the past when she worked with a therapist who confronted and challenged her.  Client Statement of Needs  Rachel Ferrell is seeking CBT to help her manage anxiety related to current stressors and past traumas.  Treatment Level  Biweekly  Symptoms  Anxiety: worried thoughts, difficulty  relaxing, fears for the future, poor self-image, emerging social anxiety (Status: maintained). Depression: uncontrollable tearfulness, weight gain, emotional eating, depressed mood (Status: maintained).  Problems Addressed  New Description, New Description  Goals 1. Rachel Ferrell experiences anxiety related to her son's well-being, as well as difficulty processing emotions relating to current situations and past traumas Objective Rachel Ferrell will be provided with opportunities to process past traumatic experiences Target Date: 2022-12-08 Frequency: Daily  Progress: 30 Modality: individual  Related Interventions Therapist will help Rachel Ferrell to process her relationships with family members in order to better understand how she may be unwittingly repeating negative patterns from her past Therapist will provide Rachel Ferrell with opportunities to process her experiences in session Objective Rachel Ferrell will develop strategies to help her regulate and process her emotions, including her anxiety and grief Target Date: 2022-12-08 Frequency: Biweekly  Progress: 20 Modality: individual  Related Interventions Therapist will help Rachel Ferrell to identify and assert appropriate boundaries in her relationships, as well as provide her with communication strategies to help her communicate her thoughts and feelings Therapist will provide Rachel Ferrell with referrals to additional resources as appropriate Therapist will provide Rachel Ferrell with strategies to help her regulate her emotions, including deep breathing, meditation, mindfulness, and self-care Therapist will help Rachel Ferrell to identify and disengage from negative thought patterns using CBT-based strategies Therapist will engage Rachel Ferrell in behavior activation, including incorporation of pleasant and mastery events into her daily routine 2. Rachel Ferrell experiences grief and depressive symptoms related to her father's passing and her challenging relationship with her mother Diagnosis Axis none 300.00 (Anxiety state,  unspecified) - Open - [Signifier: n/a]    Conditions For Discharge Achievement of treatment goals and objectives       [name], PhD               [name], PhD

## 2022-08-08 ENCOUNTER — Ambulatory Visit: Payer: Federal, State, Local not specified - PPO | Admitting: Clinical

## 2022-08-08 DIAGNOSIS — F419 Anxiety disorder, unspecified: Secondary | ICD-10-CM | POA: Diagnosis not present

## 2022-08-08 NOTE — Progress Notes (Signed)
Diagnosis: Anxiety NOS, F41.9 Session time: 8:00 am- 8:57 am CPT code: [id number]  Mirlande was seen in person for individual therapy. Session focused on her recent trip to a wellness retreat, as well as developments in her relationships and a new job opportunity that had arisen. Therapist offered validation and support, and worked with her to consider the pros and cons of various options. She is scheduled to be seen again in two weeks.   Objectives Related Problem: Cyra experiences anxiety related to her son's well-being, as well as difficulty processing emotions relating to current situations and past traumas Description: Libbi will develop strategies to help her regulate and process her emotions, including her anxiety and grief Target Date: 2022-12-08 Frequency: Biweekly Modality: individual Progress: 20% Planned Intervention: Therapist will help Frenchie to identify and disengage from negative thought patterns using CBT-based strategies  Planned Intervention: Therapist will provide Mykelti with referrals to additional resources as appropriate  Related Problem: Lavren experiences anxiety related to her son's well-being, as well as difficulty processing emotions relating to current situations and past traumas Description: Temica will be provided with opportunities to process past traumatic experiences Target Date: 2022-12-08 Frequency: Daily Modality: individual Progress: 30% Planned Intervention: Therapist will provide Rolande with opportunities to process her experiences in session   Treatment Plan Client Abilities/Strengths  Leisel presented as insightful and motivated to improve her situation.  Client Treatment Preferences  Clorene reported that she has found therapy most helpful in the past when she worked with a therapist who confronted and challenged her.  Client Statement of Needs  Erini is seeking CBT to help her manage anxiety related to current stressors and past traumas.  Treatment  Level  Biweekly  Symptoms  Anxiety: worried thoughts, difficulty relaxing, fears for the future, poor self-image, emerging social anxiety (Status: maintained). Depression: uncontrollable tearfulness, weight gain, emotional eating, depressed mood (Status: maintained).  Problems Addressed  New Description, New Description  Goals 1. Julie experiences anxiety related to her son's well-being, as well as difficulty processing emotions relating to current situations and past traumas Objective Tennille will be provided with opportunities to process past traumatic experiences Target Date: 2022-12-08 Frequency: Daily  Progress: 30 Modality: individual  Related Interventions Therapist will help Kelyn to process her relationships with family members in order to better understand how she may be unwittingly repeating negative patterns from her past Therapist will provide Jerrah with opportunities to process her experiences in session Objective Lada will develop strategies to help her regulate and process her emotions, including her anxiety and grief Target Date: 2022-12-08 Frequency: Biweekly  Progress: 20 Modality: individual  Related Interventions Therapist will help Kahlie to identify and assert appropriate boundaries in her relationships, as well as provide her with communication strategies to help her communicate her thoughts and feelings Therapist will provide Pansey with referrals to additional resources as appropriate Therapist will provide Tejasvi with strategies to help her regulate her emotions, including deep breathing, meditation, mindfulness, and self-care Therapist will help Blessings to identify and disengage from negative thought patterns using CBT-based strategies Therapist will engage Dilan in behavior activation, including incorporation of pleasant and mastery events into her daily routine 2. [name] experiences grief and depressive symptoms related to her father's passing and her challenging  relationship with her mother Diagnosis Axis none 300.00 (Anxiety state, unspecified) - Open - [Signifier: n/a]    Conditions For Discharge Achievement of treatment goals and objectives       [name], PhD  [name], PhD               [name], PhD

## 2022-08-14 ENCOUNTER — Other Ambulatory Visit: Payer: Self-pay | Admitting: Adult Health

## 2022-08-14 DIAGNOSIS — F909 Attention-deficit hyperactivity disorder, unspecified type: Secondary | ICD-10-CM

## 2022-08-15 NOTE — Telephone Encounter (Signed)
Noted  

## 2022-08-15 NOTE — Telephone Encounter (Signed)
Can you please deny this

## 2022-08-15 NOTE — Telephone Encounter (Signed)
Look like pt should still have a refill on file. Please advise

## 2022-08-18 ENCOUNTER — Telehealth: Payer: Self-pay | Admitting: Adult Health

## 2022-08-18 NOTE — Telephone Encounter (Signed)
Prescription Request  08/18/2022  LOV: 06/18/2022  What is the name of the medication or equipment? amphetamine-dextroamphetamine (ADDERALL XR) 10 MG 24 hr capsule   Have you contacted your pharmacy to request a refill? Yes   Which pharmacy would you like this sent to?  [facility] [place], [place] - [address] [place] [address] Phone: [phone] Fax: [phone]    Patient notified that their request is being sent to the clinical staff for review and that they should receive a response within 2 business days.   Please advise at Mobile [phone] (mobile)

## 2022-08-19 NOTE — Telephone Encounter (Signed)
Called pt again to advised that medication was already sent in as advised a week ago but no answer.

## 2022-08-21 ENCOUNTER — Ambulatory Visit: Payer: Federal, State, Local not specified - PPO | Admitting: Clinical

## 2022-08-21 DIAGNOSIS — F419 Anxiety disorder, unspecified: Secondary | ICD-10-CM

## 2022-08-21 NOTE — Progress Notes (Signed)
Diagnosis: Anxiety NOS, F41.9 Session time: 2:00 pm- 2:57 pm CPT code: [id number]  Sophie was seen in person for individual therapy. Session focused on continued developments in her marriage. Therapist offered an opportunity to process, as well as suggested communication strategies and worked with [name] to explore pros and cons of various options. She is scheduled to be seen again in three weeks.   Objectives Related Problem: Uriah experiences anxiety related to her son's well-being, as well as difficulty processing emotions relating to current situations and past traumas Description: Renatha will develop strategies to help her regulate and process her emotions, including her anxiety and grief Target Date: 2022-12-08 Frequency: Biweekly Modality: individual Progress: 20% Planned Intervention: Therapist will help Zoriyah to identify and disengage from negative thought patterns using CBT-based strategies  Planned Intervention: Therapist will provide Mykea with referrals to additional resources as appropriate  Related Problem: Mircle experiences anxiety related to her son's well-being, as well as difficulty processing emotions relating to current situations and past traumas Description: Lonette will be provided with opportunities to process past traumatic experiences Target Date: 2022-12-08 Frequency: Daily Modality: individual Progress: 30% Planned Intervention: Therapist will provide Crisol with opportunities to process her experiences in session   Treatment Plan Client Abilities/Strengths  Hadeel presented as insightful and motivated to improve her situation.  Client Treatment Preferences  Brexleigh reported that she has found therapy most helpful in the past when she worked with a therapist who confronted and challenged her.  Client Statement of Needs  Margarit is seeking CBT to help her manage anxiety related to current stressors and past traumas.  Treatment Level  Biweekly  Symptoms  Anxiety:  worried thoughts, difficulty relaxing, fears for the future, poor self-image, emerging social anxiety (Status: maintained). Depression: uncontrollable tearfulness, weight gain, emotional eating, depressed mood (Status: maintained).  Problems Addressed  New Description, New Description  Goals 1. Katurah experiences anxiety related to her son's well-being, as well as difficulty processing emotions relating to current situations and past traumas Objective Rosland will be provided with opportunities to process past traumatic experiences Target Date: 2022-12-08 Frequency: Daily  Progress: 30 Modality: individual  Related Interventions Therapist will help Severa to process her relationships with family members in order to better understand how she may be unwittingly repeating negative patterns from her past Therapist will provide Cynthya with opportunities to process her experiences in session Objective Maryah will develop strategies to help her regulate and process her emotions, including her anxiety and grief Target Date: 2022-12-08 Frequency: Biweekly  Progress: 20 Modality: individual  Related Interventions Therapist will help Ramyah to identify and assert appropriate boundaries in her relationships, as well as provide her with communication strategies to help her communicate her thoughts and feelings Therapist will provide Kyliana with referrals to additional resources as appropriate Therapist will provide Roisin with strategies to help her regulate her emotions, including deep breathing, meditation, mindfulness, and self-care Therapist will help Luann to identify and disengage from negative thought patterns using CBT-based strategies Therapist will engage Deissy in behavior activation, including incorporation of pleasant and mastery events into her daily routine 2. [name] experiences grief and depressive symptoms related to her father's passing and her challenging relationship with her  mother Diagnosis Axis none 300.00 (Anxiety state, unspecified) - Open - [Signifier: n/a]    Conditions For Discharge Achievement of treatment goals and objectives       [name], PhD           [name], PhD  [name], PhD

## 2022-09-09 ENCOUNTER — Ambulatory Visit: Payer: Federal, State, Local not specified - PPO | Admitting: Clinical

## 2022-09-22 ENCOUNTER — Ambulatory Visit (INDEPENDENT_AMBULATORY_CARE_PROVIDER_SITE_OTHER): Payer: Federal, State, Local not specified - PPO | Admitting: Clinical

## 2022-09-22 DIAGNOSIS — F419 Anxiety disorder, unspecified: Secondary | ICD-10-CM | POA: Diagnosis not present

## 2022-09-22 NOTE — Progress Notes (Unsigned)
Diagnosis: Anxiety NOS, F41.9 Session time: 12:00 pm- 12:57 pm CPT code: 02542H-06  Karline was seen remotely using secure video conferencing. She was in her home and therapist was in her office at the time of the appointment. She and her husband had started couple's therapy, with plans to attend weekly. She also reflected upon developments in her career, including an upcoming trip. Therapist offered an opportunity to process, providing validation and support. She is scheduled to be seen again in three weeks.   Objectives Related Problem: Jazzlynne experiences anxiety related to her son's well-being, as well as difficulty processing emotions relating to current situations and past traumas Description: Paricia will develop strategies to help her regulate and process her emotions, including her anxiety and grief Target Date: 2022-12-08 Frequency: Biweekly Modality: individual Progress: 20% Planned Intervention: Therapist will help Allyne to identify and disengage from negative thought patterns using CBT-based strategies  Planned Intervention: Therapist will provide Erum with referrals to additional resources as appropriate  Related Problem: Anta experiences anxiety related to her son's well-being, as well as difficulty processing emotions relating to current situations and past traumas Description: Mianicole will be provided with opportunities to process past traumatic experiences Target Date: 2022-12-08 Frequency: Daily Modality: individual Progress: 30% Planned Intervention: Therapist will provide Kevianna with opportunities to process her experiences in session   Treatment Plan Client Abilities/Strengths  Justine presented as insightful and motivated to improve her situation.  Client Treatment Preferences  Hilaria reported that she has found therapy most helpful in the past when she worked with a therapist who confronted and challenged her.  Client Statement of Needs  Dejanelle is seeking CBT to help her  manage anxiety related to current stressors and past traumas.  Treatment Level  Biweekly  Symptoms  Anxiety: worried thoughts, difficulty relaxing, fears for the future, poor self-image, emerging social anxiety (Status: maintained). Depression: uncontrollable tearfulness, weight gain, emotional eating, depressed mood (Status: maintained).  Problems Addressed  New Description, New Description  Goals 1. Srija experiences anxiety related to her son's well-being, as well as difficulty processing emotions relating to current situations and past traumas Objective Margarida will be provided with opportunities to process past traumatic experiences Target Date: 2022-12-08 Frequency: Daily  Progress: 30 Modality: individual  Related Interventions Therapist will help Camary to process her relationships with family members in order to better understand how she may be unwittingly repeating negative patterns from her past Therapist will provide Mansi with opportunities to process her experiences in session Objective Cricket will develop strategies to help her regulate and process her emotions, including her anxiety and grief Target Date: 2022-12-08 Frequency: Biweekly  Progress: 20 Modality: individual  Related Interventions Therapist will help Yenia to identify and assert appropriate boundaries in her relationships, as well as provide her with communication strategies to help her communicate her thoughts and feelings Therapist will provide Glendora with referrals to additional resources as appropriate Therapist will provide Zykera with strategies to help her regulate her emotions, including deep breathing, meditation, mindfulness, and self-care Therapist will help Mazola to identify and disengage from negative thought patterns using CBT-based strategies Therapist will engage Hannahmae in behavior activation, including incorporation of pleasant and mastery events into her daily routine 2. [name] experiences grief and  depressive symptoms related to her father's passing and her challenging relationship with her mother Diagnosis Axis none 300.00 (Anxiety state, unspecified) - Open - [Signifier: n/a]    Conditions For Discharge Achievement of treatment goals and objectives       [place]  Melida Quitter, PhD                 [name], PhD               [name], PhD

## 2022-09-23 ENCOUNTER — Encounter: Payer: Self-pay | Admitting: Adult Health

## 2022-09-23 ENCOUNTER — Other Ambulatory Visit: Payer: Self-pay | Admitting: Adult Health

## 2022-09-23 DIAGNOSIS — F909 Attention-deficit hyperactivity disorder, unspecified type: Secondary | ICD-10-CM

## 2022-09-23 MED ORDER — AMPHETAMINE-DEXTROAMPHET ER 10 MG PO CP24: 10.0000 mg | ORAL_CAPSULE | Freq: Every day | ORAL | 0 refills | Status: DC

## 2022-09-23 NOTE — Telephone Encounter (Signed)
Okay for refill?  

## 2022-09-23 NOTE — Telephone Encounter (Signed)
Please advise 

## 2022-09-25 ENCOUNTER — Encounter: Payer: Self-pay | Admitting: Gastroenterology

## 2022-09-25 MED ORDER — AMOXICILLIN-POT CLAVULANATE 875-125 MG PO TABS: 1.0000 | ORAL_TABLET | Freq: Two times a day (BID) | ORAL | 0 refills | Status: AC

## 2022-09-25 MED ORDER — DICYCLOMINE HCL 20 MG PO TABS: 20.0000 mg | ORAL_TABLET | Freq: Four times a day (QID) | ORAL | 0 refills | Status: DC | PRN

## 2022-09-26 ENCOUNTER — Ambulatory Visit: Payer: Federal, State, Local not specified - PPO | Admitting: Adult Health

## 2022-09-26 ENCOUNTER — Encounter: Payer: Self-pay | Admitting: Adult Health

## 2022-09-26 VITALS — BP 100/70 | HR 70 | Temp 98.1°F | Ht 65.0 in | Wt 146.0 lb

## 2022-09-26 DIAGNOSIS — E785 Hyperlipidemia, unspecified: Secondary | ICD-10-CM

## 2022-09-26 DIAGNOSIS — F909 Attention-deficit hyperactivity disorder, unspecified type: Secondary | ICD-10-CM | POA: Diagnosis not present

## 2022-09-26 DIAGNOSIS — K5792 Diverticulitis of intestine, part unspecified, without perforation or abscess without bleeding: Secondary | ICD-10-CM | POA: Diagnosis not present

## 2022-09-26 DIAGNOSIS — R7303 Prediabetes: Secondary | ICD-10-CM

## 2022-09-26 LAB — CBC WITH DIFFERENTIAL/PLATELET
Basophils Absolute: 0.2 10*3/uL — ABNORMAL HIGH (ref 0.0–0.1)
Basophils Relative: 1.3 % (ref 0.0–3.0)
Eosinophils Absolute: 0.2 10*3/uL (ref 0.0–0.7)
Eosinophils Relative: 1.5 % (ref 0.0–5.0)
HCT: 44.8 % (ref 36.0–46.0)
Hemoglobin: 15 g/dL (ref 12.0–15.0)
Lymphocytes Relative: 14.9 % (ref 12.0–46.0)
Lymphs Abs: 1.9 10*3/uL (ref 0.7–4.0)
MCHC: 33.5 g/dL (ref 30.0–36.0)
MCV: 90 fl (ref 78.0–100.0)
Monocytes Absolute: 0.7 10*3/uL (ref 0.1–1.0)
Monocytes Relative: 5.3 % (ref 3.0–12.0)
Neutro Abs: 9.9 10*3/uL — ABNORMAL HIGH (ref 1.4–7.7)
Neutrophils Relative %: 77 % (ref 43.0–77.0)
Platelets: 289 10*3/uL (ref 150.0–400.0)
RBC: 4.98 Mil/uL (ref 3.87–5.11)
RDW: 14.4 % (ref 11.5–15.5)
WBC: 12.8 10*3/uL — ABNORMAL HIGH (ref 4.0–10.5)

## 2022-09-26 LAB — BASIC METABOLIC PANEL
BUN: 16 mg/dL (ref 6–23)
CO2: 27 mEq/L (ref 19–32)
Calcium: 9.6 mg/dL (ref 8.4–10.5)
Chloride: 101 mEq/L (ref 96–112)
Creatinine, Ser: 0.9 mg/dL (ref 0.40–1.20)
GFR: 79.35 mL/min (ref >60.00)
Glucose, Bld: 84 mg/dL (ref 70–99)
Potassium: 4 mEq/L (ref 3.5–5.1)
Sodium: 135 mEq/L (ref 135–145)

## 2022-09-26 LAB — LIPID PANEL
Cholesterol: 168 mg/dL (ref 0–200)
HDL: 59.8 mg/dL (ref >39.00)
LDL Cholesterol: 91 mg/dL (ref 0–99)
NonHDL: 108.17
Total CHOL/HDL Ratio: 3
Triglycerides: 88 mg/dL (ref 0.0–149.0)
VLDL: 17.6 mg/dL (ref 0.0–40.0)

## 2022-09-26 LAB — HEMOGLOBIN A1C: Hgb A1c MFr Bld: 5.6 % (ref 4.6–6.5)

## 2022-09-26 MED ORDER — FLUCONAZOLE 150 MG PO TABS: ORAL_TABLET | ORAL | 0 refills | Status: DC

## 2022-09-26 NOTE — Progress Notes (Signed)
Subjective:    Patient ID: Rachel Ferrell, female    DOB: March 06, 1981, 42 y.o.   MRN: [id number]  HPI 42 year old female who  has a past medical history of ADHD, Asthma, Chronic sinusitis, Diverticulosis, GERD (gastroesophageal reflux disease), and Migraine.  She presents to the office today for follow up regarding hyperlipidemia and prediabetes. During her CPE three months ago it was noted that her cholesterol level was slightly elevated with an LDL of 124 and her A1c was 5.8. She was advised to work on lifestyle modifications.She reports that she has been working hard with watching what she has been eating and is on a high protein diet and is exercising every day. She feels much better overall.   Wt Readings from Last 3 Encounters:  09/26/22 146 lb (66.2 kg)  06/24/22 165 lb (74.8 kg)  06/18/22 166 lb (75.3 kg)    Of note, she was also stared on Augmentin yesterday by GI for suspected reoccurrence of diverticulitis. She has pain in her left lower quadrant. Will start abx this morning. She is wondering if I can send in diflucan as she tends to get yeast infections with abx.   ADHD - managed with Adderall 10 mg daily. She feels well controlled on this medication. Denies side effects such as insomnia, anorexia or palpitations.    Review of Systems See HPI   Past Medical History:  Diagnosis Date   ADHD    Asthma    Chronic sinusitis    Diverticulosis    GERD (gastroesophageal reflux disease)    Migraine     Social History   Socioeconomic History   Marital status: Married    Spouse name: Not on file   Number of children: 2   Years of education: Not on file   Highest education level: Master's degree (e.g., MA, MS, MEng, MEd, MSW, MBA)  Occupational History    Employer: Advertising copywriter    Comment: Audits medical claims  Tobacco Use   Smoking status: Never   Smokeless tobacco: Never  Vaping Use   Vaping Use: Never used  Substance and Sexual Activity   Alcohol use: No    Drug use: No   Sexual activity: Yes    Birth control/protection: None  Other Topics Concern   Not on file  Social History Narrative   Married    Two children 63 and 85 years old.    Social Determinants of Health   Financial Resource Strain: Low Risk  (09/25/2022)   Overall Financial Resource Strain (CARDIA)    Difficulty of Paying Living Expenses: Not hard at all  Food Insecurity: No Food Insecurity (09/25/2022)   Hunger Vital Sign    Worried About Running Out of Food in the Last Year: Never true    Ran Out of Food in the Last Year: Never true  Transportation Needs: No Transportation Needs (09/25/2022)   PRAPARE - Administrator, Civil Service (Medical): No    Lack of Transportation (Non-Medical): No  Physical Activity: Sufficiently Active (09/25/2022)   Exercise Vital Sign    Days of Exercise per Week: 6 days    Minutes of Exercise per Session: 40 min  Stress: No Stress Concern Present (09/25/2022)   [employer] of Occupational Health - Occupational Stress Questionnaire    Feeling of Stress : Only a little  Social Connections: Socially Integrated (09/25/2022)   Social Connection and Isolation Panel [NHANES]    Frequency of Communication with Friends and Family: More  than three times a week    Frequency of Social Gatherings with Friends and Family: Once a week    Attends Religious Services: More than 4 times per year    Active Member of [employer] or Organizations: Yes    Attends Engineer, structural: More than 4 times per year    Marital Status: Married  Catering manager Violence: Not on file    Past Surgical History:  Procedure Laterality Date   ABLATION     2017- uterine to reduce periods   NASAL SEPTUM SURGERY     2012   REFRACTIVE SURGERY Bilateral    12/2021   SINUS IRRIGATION      Family History  Problem Relation Age of Onset   Thyroid disease Mother    Hypertension Father    Dementia Father    Thyroid disease Father    Drug abuse Brother     Depression Brother    Alcohol abuse Brother    ADD / ADHD Son    Vision loss Maternal Grandmother    Dementia Maternal Grandmother    Dementia Paternal Grandmother    Cancer - Colon Neg Hx    Stomach cancer Neg Hx    Esophageal cancer Neg Hx    Pancreatic cancer Neg Hx    Colon cancer Neg Hx    Rectal cancer Neg Hx     No Known Allergies  Current Outpatient Medications on File Prior to Visit  Medication Sig Dispense Refill   albuterol (PROVENTIL HFA;VENTOLIN HFA) 108 (90 BASE) MCG/ACT inhaler Inhale 2 puffs into the lungs every 6 (six) hours as needed for wheezing.     amoxicillin-clavulanate (AUGMENTIN) 875-125 MG tablet Take 1 tablet by mouth 2 (two) times daily for 10 days. 20 tablet 0   amphetamine-dextroamphetamine (ADDERALL XR) 10 MG 24 hr capsule Take 1 capsule (10 mg total) by mouth daily. 30 capsule 0   amphetamine-dextroamphetamine (ADDERALL XR) 10 MG 24 hr capsule Take 1 capsule (10 mg total) by mouth daily. 30 capsule 0   amphetamine-dextroamphetamine (ADDERALL XR) 10 MG 24 hr capsule Take 1 capsule (10 mg total) by mouth daily. 30 capsule 0   Cholecalciferol (VITAMIN D) 2000 units tablet Take 2,000 Units by mouth See admin instructions. Take 1 tablet (2000 units) by mouth daily during winter months and 1 tablet (2000 units) every other day during summer months     dicyclomine (BENTYL) 20 MG tablet Take 1 tablet (20 mg total) by mouth every 6 (six) hours as needed (abdominal pain). 30 tablet 0   fluticasone (FLONASE) 50 MCG/ACT nasal spray Place 1 spray into both nostrils at bedtime.      fluticasone furoate-vilanterol (BREO ELLIPTA) 200-25 MCG/INH AEPB Inhale 1 puff into the lungs daily.     montelukast (SINGULAIR) 10 MG tablet Take 10 mg by mouth at bedtime.     morphine (MSIR) 15 MG tablet Take 0.5 tablets (7.5 mg total) by mouth every 4 (four) hours as needed for severe pain. 3 tablet 0   Multiple Vitamin (MULTIVITAMIN WITH MINERALS) TABS tablet Take 2 tablets by mouth  2 (two) times daily. [place] brand     ondansetron (ZOFRAN-ODT) 4 MG disintegrating tablet  ODT q4 hours prn nausea/vomit 20 tablet 0   Tiotropium Bromide Monohydrate (SPIRIVA RESPIMAT) 1.25 MCG/ACT AERS Take 1.25 mg by mouth daily. Inhale 2 puffs by mouth daily     No current facility-administered medications on file prior to visit.    BP 100/70   Pulse  70   Temp 98.1 F (36.7 C) (Oral)   Ht  (1.651 m)   Wt 146 lb (66.2 kg)   LMP 09/09/2022   SpO2 99%   BMI 24.30 kg/m       Objective:   Physical Exam Vitals and nursing note reviewed.  Constitutional:      Appearance: She is well-developed.  Abdominal:     General: Abdomen is flat. Bowel sounds are normal.     Palpations: Abdomen is soft.     Tenderness: There is abdominal tenderness.  Skin:    General: Skin is warm and dry.  Neurological:     Mental Status: She is alert and oriented to person, place, and time.  Psychiatric:        Mood and Affect: Mood normal.        Behavior: Behavior normal.        Thought Content: Thought content normal.        Judgment: Judgment normal.        Assessment & Plan:  1. Prediabetes - She has done amazing with lifestyle modifications. Keep it up!  - Hemoglobin A1c; Future - Basic Metabolic Panel; Future - Basic Metabolic Panel - Hemoglobin A1c  2. Hyperlipidemia, unspecified hyperlipidemia type  - Lipid panel; Future - Lipid panel  3. Diverticulitis - Start abx. Follow up with GI if needed - CBC with Differential/Platelet; Future - Basic Metabolic Panel; Future - fluconazole (DIFLUCAN) 150 MG tablet; Take one tablet and then take second tablet 3 days later if needed  Dispense: 2 tablet; Refill: 0 - Basic Metabolic Panel - CBC with Differential/Platelet  4. Attention deficit hyperactivity disorder (ADHD), unspecified ADHD type - Continue with Adderall 10 mg.  - Follow up in 6 months for recheck   Shirline Frees, NP

## 2022-10-06 ENCOUNTER — Ambulatory Visit (INDEPENDENT_AMBULATORY_CARE_PROVIDER_SITE_OTHER): Payer: Federal, State, Local not specified - PPO | Admitting: Clinical

## 2022-10-06 DIAGNOSIS — F419 Anxiety disorder, unspecified: Secondary | ICD-10-CM

## 2022-10-06 NOTE — Progress Notes (Signed)
Diagnosis: Anxiety NOS, F41.9 Session time: 8:00 am- 8:57 am CPT code: 16109U-04  Aigner was seen in person for individual therapy. Session focused on processing her recent work trip, including dynamics with coworkers and the possibility of a new position. She is scheduled to be seen again in two weeks.   Objectives Related Problem: Kathlen experiences anxiety related to her son's well-being, as well as difficulty processing emotions relating to current situations and past traumas Description: Polina will develop strategies to help her regulate and process her emotions, including her anxiety and grief Target Date: 2022-12-08 Frequency: Biweekly Modality: individual Progress: 20% Planned Intervention: Therapist will help Latricia to identify and disengage from negative thought patterns using CBT-based strategies  Planned Intervention: Therapist will provide Riko with referrals to additional resources as appropriate  Related Problem: Denika experiences anxiety related to her son's well-being, as well as difficulty processing emotions relating to current situations and past traumas Description: Anyia will be provided with opportunities to process past traumatic experiences Target Date: 2022-12-08 Frequency: Daily Modality: individual Progress: 30% Planned Intervention: Therapist will provide Donica with opportunities to process her experiences in session   Treatment Plan Client Abilities/Strengths  Tessah presented as insightful and motivated to improve her situation.  Client Treatment Preferences  Sallyann reported that she has found therapy most helpful in the past when she worked with a therapist who confronted and challenged her.  Client Statement of Needs  Tavie is seeking CBT to help her manage anxiety related to current stressors and past traumas.  Treatment Level  Biweekly  Symptoms  Anxiety: worried thoughts, difficulty relaxing, fears for the future, poor self-image, emerging social  anxiety (Status: maintained). Depression: uncontrollable tearfulness, weight gain, emotional eating, depressed mood (Status: maintained).  Problems Addressed  New Description, New Description  Goals 1. Maliaka experiences anxiety related to her son's well-being, as well as difficulty processing emotions relating to current situations and past traumas Objective Tianah will be provided with opportunities to process past traumatic experiences Target Date: 2022-12-08 Frequency: Daily  Progress: 30 Modality: individual  Related Interventions Therapist will help Aubria to process her relationships with family members in order to better understand how she may be unwittingly repeating negative patterns from her past Therapist will provide Marcina with opportunities to process her experiences in session Objective Sharvi will develop strategies to help her regulate and process her emotions, including her anxiety and grief Target Date: 2022-12-08 Frequency: Biweekly  Progress: 20 Modality: individual  Related Interventions Therapist will help Sharleen to identify and assert appropriate boundaries in her relationships, as well as provide her with communication strategies to help her communicate her thoughts and feelings Therapist will provide Kalis with referrals to additional resources as appropriate Therapist will provide Sasha with strategies to help her regulate her emotions, including deep breathing, meditation, mindfulness, and self-care Therapist will help Kaedance to identify and disengage from negative thought patterns using CBT-based strategies Therapist will engage Kristia in behavior activation, including incorporation of pleasant and mastery events into her daily routine 2. [name] experiences grief and depressive symptoms related to her father's passing and her challenging relationship with her mother Diagnosis Axis none 300.00 (Anxiety state, unspecified) - Open - [Signifier: n/a]    Conditions For  Discharge Achievement of treatment goals and objectives       [name], PhD                 [name], PhD  [name], PhDDiagnosis: Anxiety NOS, F41.9 Session time: 12:00 pm- 12:57 pm CPT code: [id number]-11  Lanier was seen remotely using secure video conferencing. She was in her home and therapist was in her office at the time of the appointment. She and her husband had started couple's therapy, with plans to attend weekly. She also reflected upon developments in her career, including an upcoming trip. Therapist offered an opportunity to process, providing validation and support. She is scheduled to be seen again in three weeks.   Objectives Related Problem: Ieshia experiences anxiety related to her son's well-being, as well as difficulty processing emotions relating to current situations and past traumas Description: Jacquelyn will develop strategies to help her regulate and process her emotions, including her anxiety and grief Target Date: 2022-12-08 Frequency: Biweekly Modality: individual Progress: 20% Planned Intervention: Therapist will help Maite to identify and disengage from negative thought patterns using CBT-based strategies  Planned Intervention: Therapist will provide Ryli with referrals to additional resources as appropriate  Related Problem: Mekayla experiences anxiety related to her son's well-being, as well as difficulty processing emotions relating to current situations and past traumas Description: Charnette will be provided with opportunities to process past traumatic experiences Target Date: 2022-12-08 Frequency: Daily Modality: individual Progress: 30% Planned Intervention: Therapist will provide Tela with opportunities to process her experiences in session   Treatment Plan Client Abilities/Strengths  Shon presented as insightful and motivated to improve her situation.  Client Treatment Preferences   Baneza reported that she has found therapy most helpful in the past when she worked with a therapist who confronted and challenged her.  Client Statement of Needs  Aliah is seeking CBT to help her manage anxiety related to current stressors and past traumas.  Treatment Level  Biweekly  Symptoms  Anxiety: worried thoughts, difficulty relaxing, fears for the future, poor self-image, emerging social anxiety (Status: maintained). Depression: uncontrollable tearfulness, weight gain, emotional eating, depressed mood (Status: maintained).  Problems Addressed  New Description, New Description  Goals 1. Nayelis experiences anxiety related to her son's well-being, as well as difficulty processing emotions relating to current situations and past traumas Objective Caedence will be provided with opportunities to process past traumatic experiences Target Date: 2022-12-08 Frequency: Daily  Progress: 30 Modality: individual  Related Interventions Therapist will help Amariah to process her relationships with family members in order to better understand how she may be unwittingly repeating negative patterns from her past Therapist will provide Mahdiya with opportunities to process her experiences in session Objective Kate will develop strategies to help her regulate and process her emotions, including her anxiety and grief Target Date: 2022-12-08 Frequency: Biweekly  Progress: 20 Modality: individual  Related Interventions Therapist will help Massa to identify and assert appropriate boundaries in her relationships, as well as provide her with communication strategies to help her communicate her thoughts and feelings Therapist will provide Arella with referrals to additional resources as appropriate Therapist will provide Niajah with strategies to help her regulate her emotions, including deep breathing, meditation, mindfulness, and self-care Therapist will help Lesle to identify and disengage from negative thought  patterns using CBT-based strategies Therapist will engage Mckenlee in behavior activation, including incorporation of pleasant and mastery events into her daily routine 2. [name] experiences grief and depressive symptoms related to her father's passing and her challenging relationship with her mother Diagnosis Axis none 300.00 (Anxiety state, unspecified) - Open - [Signifier: n/a]    Conditions For Discharge Achievement of treatment goals and objectives  [name], PhD         [name], PhD               [name], PhD

## 2022-10-10 ENCOUNTER — Telehealth: Payer: Self-pay | Admitting: Gastroenterology

## 2022-10-10 NOTE — Telephone Encounter (Signed)
Inbound call from patient requesting a callback from a nurse to discuss previous MyChart recommendations. Please advise.

## 2022-10-11 ENCOUNTER — Other Ambulatory Visit: Payer: Self-pay

## 2022-10-11 ENCOUNTER — Encounter (HOSPITAL_BASED_OUTPATIENT_CLINIC_OR_DEPARTMENT_OTHER): Payer: Self-pay | Admitting: Emergency Medicine

## 2022-10-11 ENCOUNTER — Emergency Department (HOSPITAL_BASED_OUTPATIENT_CLINIC_OR_DEPARTMENT_OTHER)
Admission: EM | Admit: 2022-10-11 | Discharge: 2022-10-11 | Disposition: A | Discharge status: Home / Self Care | Payer: Federal, State, Local not specified - PPO | Attending: Emergency Medicine | Admitting: Emergency Medicine

## 2022-10-11 ENCOUNTER — Emergency Department (HOSPITAL_BASED_OUTPATIENT_CLINIC_OR_DEPARTMENT_OTHER): Payer: Federal, State, Local not specified - PPO

## 2022-10-11 DIAGNOSIS — K5792 Diverticulitis of intestine, part unspecified, without perforation or abscess without bleeding: Secondary | ICD-10-CM

## 2022-10-11 DIAGNOSIS — K5732 Diverticulitis of large intestine without perforation or abscess without bleeding: Secondary | ICD-10-CM | POA: Diagnosis not present

## 2022-10-11 DIAGNOSIS — Z7951 Long term (current) use of inhaled steroids: Secondary | ICD-10-CM | POA: Insufficient documentation

## 2022-10-11 DIAGNOSIS — J45909 Unspecified asthma, uncomplicated: Secondary | ICD-10-CM | POA: Diagnosis not present

## 2022-10-11 DIAGNOSIS — R1032 Left lower quadrant pain: Secondary | ICD-10-CM | POA: Diagnosis not present

## 2022-10-11 LAB — URINALYSIS, ROUTINE W REFLEX MICROSCOPIC
Bilirubin Urine: NEGATIVE
Glucose, UA: NEGATIVE mg/dL
Hgb urine dipstick: NEGATIVE
Ketones, ur: NEGATIVE mg/dL
Nitrite: NEGATIVE
Protein, ur: NEGATIVE mg/dL
Specific Gravity, Urine: 1.018 (ref 1.005–1.030)
pH: 5.5 (ref 5.0–8.0)

## 2022-10-11 LAB — COMPREHENSIVE METABOLIC PANEL
ALT: 10 U/L (ref 0–44)
AST: 14 U/L — ABNORMAL LOW (ref 15–41)
Albumin: 4.2 g/dL (ref 3.5–5.0)
Alkaline Phosphatase: 46 U/L (ref 38–126)
Anion gap: 8 (ref 5–15)
BUN: 17 mg/dL (ref 6–20)
CO2: 26 mmol/L (ref 22–32)
Calcium: 9.8 mg/dL (ref 8.9–10.3)
Chloride: 105 mmol/L (ref 98–111)
Creatinine, Ser: 0.81 mg/dL (ref 0.44–1.00)
GFR, Estimated: >60 mL/min (ref >60)
Glucose, Bld: 88 mg/dL (ref 70–99)
Potassium: 4.1 mmol/L (ref 3.5–5.1)
Sodium: 139 mmol/L (ref 135–145)
Total Bilirubin: 0.4 mg/dL (ref 0.3–1.2)
Total Protein: 6.9 g/dL (ref 6.5–8.1)

## 2022-10-11 LAB — CBC
HCT: 41.9 % (ref 36.0–46.0)
Hemoglobin: 13.6 g/dL (ref 12.0–15.0)
MCH: 29.4 pg (ref 26.0–34.0)
MCHC: 32.5 g/dL (ref 30.0–36.0)
MCV: 90.7 fL (ref 80.0–100.0)
Platelets: 277 10*3/uL (ref 150–400)
RBC: 4.62 MIL/uL (ref 3.87–5.11)
RDW: 13.9 % (ref 11.5–15.5)
WBC: 8.5 10*3/uL (ref 4.0–10.5)
nRBC: 0 % (ref 0.0–0.2)

## 2022-10-11 LAB — LIPASE, BLOOD: Lipase: 16 U/L (ref 11–51)

## 2022-10-11 LAB — PREGNANCY, URINE: Preg Test, Ur: NEGATIVE

## 2022-10-11 MED ORDER — METRONIDAZOLE 500 MG PO TABS: 500.0000 mg | ORAL_TABLET | Freq: Three times a day (TID) | ORAL | 0 refills | Status: AC

## 2022-10-11 MED ORDER — SODIUM CHLORIDE 0.9 % IV BOLUS
1000.0000 mL | Freq: Once | INTRAVENOUS | Status: AC
  Administered 2022-10-11: 1000 mL via INTRAVENOUS

## 2022-10-11 MED ORDER — MORPHINE SULFATE (PF) 4 MG/ML IV SOLN
4.0000 mg | Freq: Once | INTRAVENOUS | Status: AC
  Administered 2022-10-11: 4 mg via INTRAVENOUS
  Filled 2022-10-11: qty 1

## 2022-10-11 MED ORDER — ONDANSETRON HCL 4 MG/2ML IJ SOLN
4.0000 mg | Freq: Once | INTRAMUSCULAR | Status: AC
  Administered 2022-10-11: 4 mg via INTRAVENOUS
  Filled 2022-10-11: qty 2

## 2022-10-11 MED ORDER — IOHEXOL 300 MG/ML  SOLN
100.0000 mL | Freq: Once | INTRAMUSCULAR | Status: AC | PRN
  Administered 2022-10-11: 80 mL via INTRAVENOUS

## 2022-10-11 MED ORDER — CIPROFLOXACIN IN D5W 400 MG/200ML IV SOLN
400.0000 mg | Freq: Once | INTRAVENOUS | Status: AC
  Administered 2022-10-11: 400 mg via INTRAVENOUS
  Filled 2022-10-11: qty 200

## 2022-10-11 MED ORDER — CIPROFLOXACIN HCL 500 MG PO TABS: 500.0000 mg | ORAL_TABLET | Freq: Two times a day (BID) | ORAL | 0 refills | Status: AC

## 2022-10-11 MED ORDER — METRONIDAZOLE 500 MG/100ML IV SOLN
500.0000 mg | Freq: Once | INTRAVENOUS | Status: AC
  Administered 2022-10-11: 500 mg via INTRAVENOUS
  Filled 2022-10-11: qty 100

## 2022-10-11 NOTE — ED Triage Notes (Signed)
Pt presents to ED POV. Pt c/o LLQ abd pain and nausea. Pt has Pain began 4/18. Took antibiotic 10 day antibiotic. Pain went away after that but came back on Thursday. Hx diverticulitis.

## 2022-10-11 NOTE — ED Provider Notes (Signed)
[place] EMERGENCY DEPARTMENT AT [facility] Provider Note   CSN: [id number] Arrival date & time: 10/11/22  1656     History  Chief Complaint  Patient presents with   Abdominal Pain    Rachel Ferrell is a 42 y.o. female with past medical history significant for GERD, diverticulosis, diverticulitis, asthma presents to the ED complaining of lower left quadrant abdominal pain and nausea.  Patient states the pain began around 09/25/2022, and she was evaluated by her primary care provider.  Patient was given 10 days of Augmentin.  She reports that her symptoms resolved afterwards, but returned after approximately 1 week.  Patient has had 1 episode of diarrhea earlier today, but denies blood or black, tarry stools.  Denies fever, chills, vomiting, constipation.       Home Medications Prior to Admission medications   Medication Sig Start Date End Date Taking? Authorizing Provider  ciprofloxacin (CIPRO) 500 MG tablet Take 1 tablet (500 mg total) by mouth every 12 (twelve) hours for 10 days. 10/11/22 10/21/22 Yes Alba Kriesel R, PA-C  metroNIDAZOLE (FLAGYL) 500 MG tablet Take 1 tablet (500 mg total) by mouth 3 (three) times daily for 10 days. 10/11/22 10/21/22 Yes Meilech Virts R, PA-C  albuterol (PROVENTIL HFA;VENTOLIN HFA) 108 (90 BASE) MCG/ACT inhaler Inhale 2 puffs into the lungs every 6 (six) hours as needed for wheezing.    [provider]  amphetamine-dextroamphetamine (ADDERALL XR) 10 MG 24 hr capsule Take 1 capsule (10 mg total) by mouth daily. 09/23/22   Nafziger, [name] Keen, NP  amphetamine-dextroamphetamine (ADDERALL XR) 10 MG 24 hr capsule Take 1 capsule (10 mg total) by mouth daily. 09/23/22 10/23/22  Nafziger, [name] Keen, NP  amphetamine-dextroamphetamine (ADDERALL XR) 10 MG 24 hr capsule Take 1 capsule (10 mg total) by mouth daily. 09/23/22   Nafziger, [name] Keen, NP  Cholecalciferol (VITAMIN D) 2000 units tablet Take 2,000 Units by mouth See admin instructions. Take 1 tablet (2000 units)  by mouth daily during winter months and 1 tablet (2000 units) every other day during summer months    [provider]  dicyclomine (BENTYL) 20 MG tablet Take 1 tablet (20 mg total) by mouth every 6 (six) hours as needed (abdominal pain). 09/25/22   Jenel Lucks, MD  fluconazole (DIFLUCAN) 150 MG tablet Take one tablet and then take second tablet 3 days later if needed 09/26/22   Shirline Frees, NP  fluticasone (FLONASE) 50 MCG/ACT nasal spray Place 1 spray into both nostrils at bedtime.     [provider]  fluticasone furoate-vilanterol (BREO ELLIPTA) 200-25 MCG/INH AEPB Inhale 1 puff into the lungs daily.    [provider]  montelukast (SINGULAIR) 10 MG tablet Take 10 mg by mouth at bedtime.    [provider]  morphine (MSIR) 15 MG tablet Take 0.5 tablets (7.5 mg total) by mouth every 4 (four) hours as needed for severe pain. 06/24/22   Melene Plan, DO  Multiple Vitamin (MULTIVITAMIN WITH MINERALS) TABS tablet Take 2 tablets by mouth 2 (two) times daily. [employer] brand    [provider]  ondansetron (ZOFRAN-ODT) 4 MG disintegrating tablet 4mg  ODT q4 hours prn nausea/vomit 06/24/22   Melene Plan, DO  Tiotropium Bromide Monohydrate (SPIRIVA RESPIMAT) 1.25 MCG/ACT AERS Take 1.25 mg by mouth daily. Inhale 2 puffs by mouth daily 01/22/18   [provider]      Allergies    Patient has no known allergies.    Review of Systems   Review of Systems  Constitutional:  Positive for appetite change (Decreased). Negative for chills and fever.  Gastrointestinal:  Positive for abdominal pain, diarrhea and nausea. Negative for blood in stool, constipation and vomiting.    Physical Exam Updated Vital Signs BP 99/77   Pulse 60   Temp 98 F (36.7 C)   Resp 16   LMP 09/09/2022   SpO2 100%  Physical Exam Vitals and nursing note reviewed.  Constitutional:      General: She is not in acute distress.    Appearance: Normal appearance. She is not  ill-appearing or diaphoretic.  Cardiovascular:     Rate and Rhythm: Normal rate and regular rhythm.  Pulmonary:     Effort: Pulmonary effort is normal.  Abdominal:     General: Abdomen is flat. Bowel sounds are normal.     Palpations: Abdomen is soft.     Tenderness: There is abdominal tenderness in the left upper quadrant and left lower quadrant.  Skin:    General: Skin is warm and dry.     Capillary Refill: Capillary refill takes less than 2 seconds.  Neurological:     Mental Status: She is alert. Mental status is at baseline.  Psychiatric:        Mood and Affect: Mood normal.        Behavior: Behavior normal.     ED Results / Procedures / Treatments   Labs (all labs ordered are listed, but only abnormal results are displayed) Labs Reviewed  COMPREHENSIVE METABOLIC PANEL - Abnormal; Notable for the following components:      Result Value   AST 14 (*)    All other components within normal limits  URINALYSIS, ROUTINE W REFLEX MICROSCOPIC - Abnormal; Notable for the following components:   Leukocytes,Ua TRACE (*)    Bacteria, UA RARE (*)    All other components within normal limits  LIPASE, BLOOD  CBC  PREGNANCY, URINE    EKG None  Radiology CT ABDOMEN PELVIS W CONTRAST  Result Date: 10/11/2022 CLINICAL DATA:  Left lower quadrant abdominal pain and nausea EXAM: CT ABDOMEN AND PELVIS WITH CONTRAST TECHNIQUE: Multidetector CT imaging of the abdomen and pelvis was performed using the standard protocol following bolus administration of intravenous contrast. RADIATION DOSE REDUCTION: This exam was performed according to the departmental dose-optimization program which includes automated exposure control, adjustment of the mA and/or kV according to patient size and/or use of iterative reconstruction technique. CONTRAST:  80mL OMNIPAQUE IOHEXOL 300 MG/ML  SOLN COMPARISON:  CT abdomen and pelvis 06/24/2022 FINDINGS: Lower chest: No acute abnormality. Hepatobiliary: Focal fatty  infiltration along the falciform ligament. Unremarkable gallbladder and biliary tree. Pancreas: Unremarkable. Spleen: Unremarkable. Adrenals/Urinary Tract: Stable adrenal glands. No urinary calculi or hydronephrosis. Unremarkable bladder. Stomach/Bowel: Normal caliber large and small bowel. Colonic diverticulosis. Questionable mild wall thickening and adjacent stranding about the colon in the splenic flexure. No bowel wall thickening. Stomach is within normal limits. Normal appendix. Vascular/Lymphatic: No significant vascular findings are present. No enlarged abdominal or pelvic lymph nodes. Reproductive: Uterus and bilateral adnexa are unremarkable. Other: No free intraperitoneal fluid or air. Musculoskeletal: No acute osseous abnormality. IMPRESSION: Possible mild diverticulitis at the splenic flexure. Electronically Signed   By: Minerva Fester M.D.   On: 10/11/2022 19:28    Procedures Procedures    Medications Ordered in ED Medications  iohexol (OMNIPAQUE) 300 MG/ML solution 100 mL (80 mLs Intravenous Contrast Given 10/11/22 1905)  sodium chloride 0.9 % bolus 1,000 mL (0 mLs Intravenous Stopped 10/11/22 2018)  morphine (  PF) 4 MG/ML injection 4 mg (4 mg Intravenous Given 10/11/22 1947)  ondansetron (ZOFRAN) injection 4 mg (4 mg Intravenous Given 10/11/22 1947)  ciprofloxacin (CIPRO) IVPB 400 mg (0 mg Intravenous Stopped 10/11/22 2155)    And  metroNIDAZOLE (FLAGYL) IVPB 500 mg (0 mg Intravenous Stopped 10/11/22 2155)    ED Course/ Medical Decision Making/ A&P                             Medical Decision Making Amount and/or Complexity of Data Reviewed Labs: ordered.  Risk Prescription drug management.   This patient presents to the ED with chief complaint(s) of left-sided abdominal pain with pertinent past medical history of diverticulitis.  The complaint involves an extensive differential diagnosis and also carries with it a high risk of complications and morbidity.    The differential  diagnosis includes diverticulitis, gastroenteritis, colitis  The initial plan is to obtain abdominal pain workup and CT scan  Additional history obtained: Records reviewed   primary care notes.  Patient was placed on 10 days of Augmentin for treatment of acute diverticulitis.  Initial Assessment:   Exam significant for tenderness to palpation of the left upper and lower quadrants.  No abdominal distention.  No appreciable hernias.  Skin is warm and dry, no jaundice.  Patient appears uncomfortable, but is not in acute distress.  Independent ECG/labs interpretation:  The following labs were independently interpreted:  CBC without leukocytosis or anemia.  Metabolic panel without electrolyte disturbance.  LFTs are not elevated.  Renal function is normal.  UA without evidence of significant infection.  Lipase not indicative of pancreatitis.  Independent visualization and interpretation of imaging: I independently visualized the following imaging with scope of interpretation limited to determining acute life threatening conditions related to emergency care: CT abdomen pelvis, which revealed diverticulitis at the splenic flexure.  I agree with radiologist interpretation.  Treatment and Reassessment: Patient given IV fluids, Zofran, and morphine with improvement in symptoms.  Patient does have mild diverticulitis without complication.  Discussed treatment options including dose of IV antibiotics and transitioning to oral antibiotics for continued outpatient management, which patient agrees with.  Patient does not want to be admitted to the hospital at this time.  Will give IV metronidazole and ciprofloxacin, and prescribed 10 days of same for outpatient therapy.  Patient does have gastroenterology follow-up.  Disposition:   Patient is appropriate for discharge home at this time.  Metronidazole and ciprofloxacin sent to patient's pharmacy. The patient has been appropriately medically screened and/or  stabilized in the ED. I have low suspicion for any other emergent medical condition which would require further screening, evaluation or treatment in the ED or require inpatient management. At time of discharge the patient is hemodynamically stable and in no acute distress. I have discussed work-up results and diagnosis with patient and answered all questions. Patient is agreeable with discharge plan. We discussed strict return precautions for returning to the emergency department and they verbalized understanding.            Final Clinical Impression(s) / ED Diagnoses Final diagnoses:  Diverticulitis    Rx / DC Orders ED Discharge Orders          Ordered    metroNIDAZOLE (FLAGYL) 500 MG tablet  3 times daily        10/11/22 2133    ciprofloxacin (CIPRO) 500 MG tablet  Every 12 hours        10/11/22  2133              Melton Alar R, PA-C 10/11/22 2212    Glyn Ade, MD 10/12/22 [phone]

## 2022-10-11 NOTE — Discharge Instructions (Addendum)
Thank you for allowing me to be a part of your care today.    I have sent over 2 antibiotics to the pharmacy to continue to treat your diverticulitis.  Please follow-up with your gastroenterologist and primary care provider as soon as possible.    Return to the ED if you experience worsening of your symptoms, are unable to tolerate eating or drinking, or if you have any new concerns.

## 2022-10-13 NOTE — Telephone Encounter (Signed)
Pt was seen in the ER 5/4 and treated for diverticulitis.

## 2022-10-17 ENCOUNTER — Ambulatory Visit (INDEPENDENT_AMBULATORY_CARE_PROVIDER_SITE_OTHER): Payer: Federal, State, Local not specified - PPO | Admitting: Clinical

## 2022-10-17 DIAGNOSIS — F419 Anxiety disorder, unspecified: Secondary | ICD-10-CM | POA: Diagnosis not present

## 2022-10-17 NOTE — Progress Notes (Signed)
Diagnosis: Anxiety NOS, F41.9 Session time: 4:00 pm- 4:57 pm CPT code: [id number]  Maryrose was seen remotely using secure video conferencing. She was in her home and therapist was in her office at the time of the appointment. Session focused on exploring dynamics in her relationships. Therapist queried whether she may enter into relationships aware of issues because it allows her to maintain people at arms' length. She is scheduled to be seen again in two weeks.   Objectives Related Problem: Shatanna experiences anxiety related to her son's well-being, as well as difficulty processing emotions relating to current situations and past traumas Description: Leira will develop strategies to help her regulate and process her emotions, including her anxiety and grief Target Date: 2022-12-08 Frequency: Biweekly Modality: individual Progress: 20% Planned Intervention: Therapist will help Shadaja to identify and disengage from negative thought patterns using CBT-based strategies  Planned Intervention: Therapist will provide Rashel with referrals to additional resources as appropriate  Related Problem: Paislynn experiences anxiety related to her son's well-being, as well as difficulty processing emotions relating to current situations and past traumas Description: Eleen will be provided with opportunities to process past traumatic experiences Target Date: 2022-12-08 Frequency: Daily Modality: individual Progress: 30% Planned Intervention: Therapist will provide Veverly with opportunities to process her experiences in session   Treatment Plan Client Abilities/Strengths  Sonnie presented as insightful and motivated to improve her situation.  Client Treatment Preferences  Concettina reported that she has found therapy most helpful in the past when she worked with a therapist who confronted and challenged her.  Client Statement of Needs  Makiko is seeking CBT to help her manage anxiety related to current stressors and  past traumas.  Treatment Level  Biweekly  Symptoms  Anxiety: worried thoughts, difficulty relaxing, fears for the future, poor self-image, emerging social anxiety (Status: maintained). Depression: uncontrollable tearfulness, weight gain, emotional eating, depressed mood (Status: maintained).  Problems Addressed  New Description, New Description  Goals 1. Marilyn experiences anxiety related to her son's well-being, as well as difficulty processing emotions relating to current situations and past traumas Objective Tsion will be provided with opportunities to process past traumatic experiences Target Date: 2022-12-08 Frequency: Daily  Progress: 30 Modality: individual  Related Interventions Therapist will help Laporscha to process her relationships with family members in order to better understand how she may be unwittingly repeating negative patterns from her past Therapist will provide Kadience with opportunities to process her experiences in session Objective Trynitee will develop strategies to help her regulate and process her emotions, including her anxiety and grief Target Date: 2022-12-08 Frequency: Biweekly  Progress: 20 Modality: individual  Related Interventions Therapist will help Kenyarda to identify and assert appropriate boundaries in her relationships, as well as provide her with communication strategies to help her communicate her thoughts and feelings Therapist will provide Pairlee with referrals to additional resources as appropriate Therapist will provide Joplin with strategies to help her regulate her emotions, including deep breathing, meditation, mindfulness, and self-care Therapist will help Jaryah to identify and disengage from negative thought patterns using CBT-based strategies Therapist will engage Jariya in behavior activation, including incorporation of pleasant and mastery events into her daily routine 2. [name] experiences grief and depressive symptoms related to her father's passing  and her challenging relationship with her mother Diagnosis Axis none 300.00 (Anxiety state, unspecified) - Open - [Signifier: n/a]    Conditions For Discharge Achievement of treatment goals and objectives       [name], PhD  [name], PhD               [name], PhDDiagnosis: Anxiety NOS, F41.9 Session time: 12:00 pm- 12:57 pm CPT code: 16109U-04  Epiphany was seen remotely using secure video conferencing. She was in her home and therapist was in her office at the time of the appointment. She and her husband had started couple's therapy, with plans to attend weekly. She also reflected upon developments in her career, including an upcoming trip. Therapist offered an opportunity to process, providing validation and support. She is scheduled to be seen again in three weeks.   Objectives Related Problem: Shaquail experiences anxiety related to her son's well-being, as well as difficulty processing emotions relating to current situations and past traumas Description: Anyiah will develop strategies to help her regulate and process her emotions, including her anxiety and grief Target Date: 2022-12-08 Frequency: Biweekly Modality: individual Progress: 20% Planned Intervention: Therapist will help Calene to identify and disengage from negative thought patterns using CBT-based strategies  Planned Intervention: Therapist will provide Jakailah with referrals to additional resources as appropriate  Related Problem: Loette experiences anxiety related to her son's well-being, as well as difficulty processing emotions relating to current situations and past traumas Description: Johnnetta will be provided with opportunities to process past traumatic experiences Target Date: 2022-12-08 Frequency: Daily Modality: individual Progress: 30% Planned Intervention: Therapist will provide Liane with opportunities to process her experiences in session    Treatment Plan Client Abilities/Strengths  Chanci presented as insightful and motivated to improve her situation.  Client Treatment Preferences  Loleta reported that she has found therapy most helpful in the past when she worked with a therapist who confronted and challenged her.  Client Statement of Needs  Agigail is seeking CBT to help her manage anxiety related to current stressors and past traumas.  Treatment Level  Biweekly  Symptoms  Anxiety: worried thoughts, difficulty relaxing, fears for the future, poor self-image, emerging social anxiety (Status: maintained). Depression: uncontrollable tearfulness, weight gain, emotional eating, depressed mood (Status: maintained).  Problems Addressed  New Description, New Description  Goals 1. Johnny experiences anxiety related to her son's well-being, as well as difficulty processing emotions relating to current situations and past traumas Objective Alycen will be provided with opportunities to process past traumatic experiences Target Date: 2022-12-08 Frequency: Daily  Progress: 30 Modality: individual  Related Interventions Therapist will help Kamecia to process her relationships with family members in order to better understand how she may be unwittingly repeating negative patterns from her past Therapist will provide Jackleen with opportunities to process her experiences in session Objective Shely will develop strategies to help her regulate and process her emotions, including her anxiety and grief Target Date: 2022-12-08 Frequency: Biweekly  Progress: 20 Modality: individual  Related Interventions Therapist will help Rojean to identify and assert appropriate boundaries in her relationships, as well as provide her with communication strategies to help her communicate her thoughts and feelings Therapist will provide Kalyne with referrals to additional resources as appropriate Therapist will provide Rico with strategies to help her regulate her  emotions, including deep breathing, meditation, mindfulness, and self-care Therapist will help Tene to identify and disengage from negative thought patterns using CBT-based strategies Therapist will engage Marjo in behavior activation, including incorporation of pleasant and mastery events into her daily routine 2. [name] experiences grief and depressive symptoms related to her father's passing and her challenging relationship with her mother Diagnosis Axis none 300.00 (Anxiety state, unspecified) - Open - [  Signifier: n/a]    Conditions For Discharge Achievement of treatment goals and objectives   [name], PhD               [name], PhD

## 2022-10-23 DIAGNOSIS — K219 Gastro-esophageal reflux disease without esophagitis: Secondary | ICD-10-CM | POA: Diagnosis not present

## 2022-10-23 DIAGNOSIS — J4550 Severe persistent asthma, uncomplicated: Secondary | ICD-10-CM | POA: Diagnosis not present

## 2022-10-23 DIAGNOSIS — J3089 Other allergic rhinitis: Secondary | ICD-10-CM | POA: Diagnosis not present

## 2022-10-23 DIAGNOSIS — B999 Unspecified infectious disease: Secondary | ICD-10-CM | POA: Diagnosis not present

## 2022-10-29 ENCOUNTER — Ambulatory Visit: Payer: Federal, State, Local not specified - PPO | Admitting: Clinical

## 2022-10-29 DIAGNOSIS — F419 Anxiety disorder, unspecified: Secondary | ICD-10-CM | POA: Diagnosis not present

## 2022-10-29 NOTE — Progress Notes (Signed)
Diagnosis: Anxiety NOS, F41.9 Session time: 9:03 am- 9:57 am CPT code: 16109U-04  Rachel Ferrell was seen in person for therapy. She was in her home and therapist was in her office at the time of the appointment. Session focused on continuing to unpack dynamics in her marriage. She shared that she and her husband had decided it was best for him to move out for a month, and therapist provided an opportunity to process this. She is scheduled to be seen again in one month, and therapist will reach out as cancellations happen.   Objectives Related Problem: Rachel Ferrell experiences anxiety related to her son's well-being, as well as difficulty processing emotions relating to current situations and past traumas Description: Rachel Ferrell will develop strategies to help her regulate and process her emotions, including her anxiety and grief Target Date: 2022-12-08 Frequency: Biweekly Modality: individual Progress: 20% Planned Intervention: Therapist will help Rachel Ferrell to identify and disengage from negative thought patterns using CBT-based strategies  Planned Intervention: Therapist will provide Rachel Ferrell with referrals to additional resources as appropriate  Related Problem: Rachel Ferrell experiences anxiety related to her son's well-being, as well as difficulty processing emotions relating to current situations and past traumas Description: Rachel Ferrell will be provided with opportunities to process past traumatic experiences Target Date: 2022-12-08 Frequency: Daily Modality: individual Progress: 30% Planned Intervention: Therapist will provide Rachel Ferrell with opportunities to process her experiences in session   Treatment Plan Client Abilities/Strengths  Rachel Ferrell presented as insightful and motivated to improve her situation.  Client Treatment Preferences  Rachel Ferrell reported that she has found therapy most helpful in the past when she worked with a therapist who confronted and challenged her.  Client Statement of Needs  Rachel Ferrell is seeking CBT to help  her manage anxiety related to current stressors and past traumas.  Treatment Level  Biweekly  Symptoms  Anxiety: worried thoughts, difficulty relaxing, fears for the future, poor self-image, emerging social anxiety (Status: maintained). Depression: uncontrollable tearfulness, weight gain, emotional eating, depressed mood (Status: maintained).  Problems Addressed  New Description, New Description  Goals 1. Rachel Ferrell experiences anxiety related to her son's well-being, as well as difficulty processing emotions relating to current situations and past traumas Objective Rachel Ferrell will be provided with opportunities to process past traumatic experiences Target Date: 2022-12-08 Frequency: Daily  Progress: 30 Modality: individual  Related Interventions Therapist will help Rachel Ferrell to process her relationships with family members in order to better understand how she may be unwittingly repeating negative patterns from her past Therapist will provide Rachel Ferrell with opportunities to process her experiences in session Objective Rachel Ferrell will develop strategies to help her regulate and process her emotions, including her anxiety and grief Target Date: 2022-12-08 Frequency: Biweekly  Progress: 20 Modality: individual  Related Interventions Therapist will help Rachel Ferrell to identify and assert appropriate boundaries in her relationships, as well as provide her with communication strategies to help her communicate her thoughts and feelings Therapist will provide Rachel Ferrell with referrals to additional resources as appropriate Therapist will provide Rachel Ferrell with strategies to help her regulate her emotions, including deep breathing, meditation, mindfulness, and self-care Therapist will help Rachel Ferrell to identify and disengage from negative thought patterns using CBT-based strategies Therapist will engage Rachel Ferrell in behavior activation, including incorporation of pleasant and mastery events into her daily routine 2. Rachel Ferrell experiences grief and  depressive symptoms related to her father's passing and her challenging relationship with her mother Diagnosis Axis none 300.00 (Anxiety state, unspecified) - Open - [Signifier: n/a]    Conditions For Discharge Achievement of treatment  goals and objectives       Rachel Ferrell Noa, PhD

## 2022-11-11 ENCOUNTER — Ambulatory Visit: Payer: Federal, State, Local not specified - PPO | Admitting: Clinical

## 2022-11-11 DIAGNOSIS — F419 Anxiety disorder, unspecified: Secondary | ICD-10-CM | POA: Diagnosis not present

## 2022-11-11 NOTE — Progress Notes (Signed)
Diagnosis: Anxiety NOS, F41.9 Session time: 1:03 pm- 2:00 pm CPT code: 16109U-04  Rachel Ferrell was seen in person for therapy. Session focused on continuing to unpack dynamics in her marriage. She reflected upon recent conversations, and therapist suggested communication strategies and engaged her in consideration of her options. She is scheduled to be seen again in one month, and therapist will reach out as cancellations happen.   Objectives Related Problem: Rachel Ferrell experiences anxiety related to her son's well-being, as well as difficulty processing emotions relating to current situations and past traumas Description: Rachel Ferrell will develop strategies to help her regulate and process her emotions, including her anxiety and grief Target Date: 2022-12-08 Frequency: Biweekly Modality: individual Progress: 20% Planned Intervention: Therapist will help Rachel Ferrell to identify and disengage from negative thought patterns using CBT-based strategies  Planned Intervention: Therapist will provide Rachel Ferrell with referrals to additional resources as appropriate  Related Problem: Rachel Ferrell experiences anxiety related to her son's well-being, as well as difficulty processing emotions relating to current situations and past traumas Description: Rachel Ferrell will be provided with opportunities to process past traumatic experiences Target Date: 2022-12-08 Frequency: Daily Modality: individual Progress: 30% Planned Intervention: Therapist will provide Rachel Ferrell with opportunities to process her experiences in session   Treatment Plan Client Abilities/Strengths  Rachel Ferrell presented as insightful and motivated to improve her situation.  Client Treatment Preferences  Rachel Ferrell reported that she has found therapy most helpful in the past when she worked with a therapist who confronted and challenged her.  Client Statement of Needs  Rachel Ferrell is seeking CBT to help her manage anxiety related to current stressors and past traumas.  Treatment Level   Biweekly  Symptoms  Anxiety: worried thoughts, difficulty relaxing, fears for the future, poor self-image, emerging social anxiety (Status: maintained). Depression: uncontrollable tearfulness, weight gain, emotional eating, depressed mood (Status: maintained).  Problems Addressed  New Description, New Description  Goals 1. Rachel Ferrell experiences anxiety related to her son's well-being, as well as difficulty processing emotions relating to current situations and past traumas Rachel Ferrell Rachel Ferrell will be provided with opportunities to process past traumatic experiences Target Date: 2022-12-08 Frequency: Daily  Progress: 30 Modality: individual  Related Interventions Therapist will help Rachel Ferrell to process her relationships with family members in order to better understand how she may be unwittingly repeating negative patterns from her past Therapist will provide Rachel Ferrell with opportunities to process her experiences in session Rachel Ferrell Rachel Ferrell will develop strategies to help her regulate and process her emotions, including her anxiety and grief Target Date: 2022-12-08 Frequency: Biweekly  Progress: 20 Modality: individual  Related Interventions Therapist will help Rachel Ferrell to identify and assert appropriate boundaries in her relationships, as well as provide her with communication strategies to help her communicate her thoughts and feelings Therapist will provide Rachel Ferrell with referrals to additional resources as appropriate Therapist will provide Rachel Ferrell with strategies to help her regulate her emotions, including deep breathing, meditation, mindfulness, and self-care Therapist will help Rachel Ferrell to identify and disengage from negative thought patterns using CBT-based strategies Therapist will engage Rachel Ferrell in behavior activation, including incorporation of pleasant and mastery events into her daily routine 2. Rachel Ferrell experiences grief and depressive symptoms related to her father's passing and her challenging  relationship with her mother Diagnosis Axis none 300.00 (Anxiety state, unspecified) - Open - [Signifier: n/a]    Conditions For Discharge Achievement of treatment goals and objectives       Rachel Noa, PhD  Rachel Noa, PhD

## 2022-12-02 ENCOUNTER — Ambulatory Visit: Payer: Federal, State, Local not specified - PPO | Admitting: Clinical

## 2022-12-02 DIAGNOSIS — F419 Anxiety disorder, unspecified: Secondary | ICD-10-CM | POA: Diagnosis not present

## 2022-12-02 NOTE — Progress Notes (Signed)
Diagnosis: Anxiety NOS, F41.9 Session time: 2:03 pm- 3:00 pm CPT code: [id number]  Rachel Ferrell was seen remotely using secure video conferencing. She was in her home and therapist was In her office. Client is aware of risks of telehealth and consented to a virtual visit. Session focused on continuing to process dynamics in her relationships. Therapist encouraged her to consider her options and preferences, and suggested communication strategies. She is scheduled to be seen again in two weeks.   Objectives Related Problem: Bera experiences anxiety related to her son's well-being, as well as difficulty processing emotions relating to current situations and past traumas Description: Rachel Ferrell will develop strategies to help her regulate and process her emotions, including her anxiety and grief Target Date: 2022-12-08 Frequency: Biweekly Modality: individual Progress: 20% Planned Intervention: Therapist will help Rachel Ferrell to identify and disengage from negative thought patterns using CBT-based strategies  Planned Intervention: Therapist will provide [name] with referrals to additional resources as appropriate  Related Problem: [name] experiences anxiety related to her son's well-being, as well as difficulty processing emotions relating to current situations and past traumas Description: Rachel Ferrell will be provided with opportunities to process past traumatic experiences Target Date: 2022-12-08 Frequency: Daily Modality: individual Progress: 30% Planned Intervention: Therapist will provide Rachel Ferrell with opportunities to process her experiences in session   Treatment Plan Client Abilities/Strengths  Rachel Ferrell presented as insightful and motivated to improve her situation.  Client Treatment Preferences  Rachel Ferrell reported that she has found therapy most helpful in the past when she worked with a therapist who confronted and challenged her.  Client Statement of Needs  Rachel Ferrell is seeking CBT to help her manage anxiety related  to current stressors and past traumas.  Treatment Level  Biweekly  Symptoms  Anxiety: worried thoughts, difficulty relaxing, fears for the future, poor self-image, emerging social anxiety (Status: maintained). Depression: uncontrollable tearfulness, weight gain, emotional eating, depressed mood (Status: maintained).  Problems Addressed  New Description, New Description  Goals 1. Rachel Ferrell experiences anxiety related to her son's well-being, as well as difficulty processing emotions relating to current situations and past traumas Objective Rachel Ferrell will be provided with opportunities to process past traumatic experiences Target Date: 2022-12-08 Frequency: Daily  Progress: 30 Modality: individual  Related Interventions Therapist will help Rachel Ferrell to process her relationships with family members in order to better understand how she may be unwittingly repeating negative patterns from her past Therapist will provide Rachel Ferrell with opportunities to process her experiences in session Objective Rachel Ferrell will develop strategies to help her regulate and process her emotions, including her anxiety and grief Target Date: 2022-12-08 Frequency: Biweekly  Progress: 20 Modality: individual  Related Interventions Therapist will help Rachel Ferrell to identify and assert appropriate boundaries in her relationships, as well as provide her with communication strategies to help her communicate her thoughts and feelings Therapist will provide Rachel Ferrell with referrals to additional resources as appropriate Therapist will provide Rachel Ferrell with strategies to help her regulate her emotions, including deep breathing, meditation, mindfulness, and self-care Therapist will help Rachel Ferrell to identify and disengage from negative thought patterns using CBT-based strategies Therapist will engage Rachel Ferrell in behavior activation, including incorporation of pleasant and mastery events into her daily routine 2. Rachel Ferrell experiences grief and depressive symptoms  related to her father's passing and her challenging relationship with her mother Diagnosis Axis none 300.00 (Anxiety state, unspecified) - Open - [Signifier: n/a]    Conditions For Discharge Achievement of treatment goals and objectives    Rachel Noa, PhD  Rachel Ferrell Rachel Mabee, PhD 

## 2022-12-03 ENCOUNTER — Ambulatory Visit: Payer: Federal, State, Local not specified - PPO | Admitting: Clinical

## 2022-12-05 ENCOUNTER — Ambulatory Visit: Payer: Federal, State, Local not specified - PPO | Admitting: Clinical

## 2022-12-08 ENCOUNTER — Encounter: Payer: Self-pay | Admitting: Internal Medicine

## 2022-12-08 ENCOUNTER — Ambulatory Visit: Payer: Federal, State, Local not specified - PPO | Admitting: Internal Medicine

## 2022-12-08 VITALS — BP 90/60 | HR 61 | Ht 65.5 in | Wt 138.0 lb

## 2022-12-08 DIAGNOSIS — Z8709 Personal history of other diseases of the respiratory system: Secondary | ICD-10-CM | POA: Diagnosis not present

## 2022-12-08 DIAGNOSIS — R053 Chronic cough: Secondary | ICD-10-CM | POA: Diagnosis not present

## 2022-12-08 DIAGNOSIS — I7300 Raynaud's syndrome without gangrene: Secondary | ICD-10-CM

## 2022-12-08 DIAGNOSIS — R0982 Postnasal drip: Secondary | ICD-10-CM

## 2022-12-08 DIAGNOSIS — M9901 Segmental and somatic dysfunction of cervical region: Secondary | ICD-10-CM | POA: Diagnosis not present

## 2022-12-08 DIAGNOSIS — R9389 Abnormal findings on diagnostic imaging of other specified body structures: Secondary | ICD-10-CM

## 2022-12-08 DIAGNOSIS — M25512 Pain in left shoulder: Secondary | ICD-10-CM | POA: Diagnosis not present

## 2022-12-08 DIAGNOSIS — R062 Wheezing: Secondary | ICD-10-CM

## 2022-12-08 LAB — SEDIMENTATION RATE: Sed Rate: 17 mm/hr (ref 0–20)

## 2022-12-08 LAB — POCT EXHALED NITRIC OXIDE: FeNO level (ppb): 14

## 2022-12-08 NOTE — Patient Instructions (Addendum)
ICD-10-CM   1. Chronic cough  R05.3     2. Wheezing  R06.2     3. Post-nasal drip  R09.82     4. Raynaud's phenomenon without gangrene  I73.00     5. History of asthma  Z87.09     6. History of sinusitis  Z87.09     7. Abnormal chest CT  R93.89       You appear to have Raynaud's You have a history of sinusiits  YOu also have cough and wheezing despite breo and singulair  Prior CXR/CT 2013  with lingular and Right Middle scarring and 2024 CT abdoment lung image with possible air trapping v mild inflammation in left lower lobe  There is also history of exposure to feather pillow and blanket  We need to understand all of this  Plan  - Do CT scan of the sinuses without contrast - Do high-resolution CT chest supine and prone inspiratory and expiratory phase volume -Do full pulmonary function test -Do blood work for ESR, ANA, double-stranded DNA, rheumatoid factor, CCP, SCL-70, SSA, SSB, CK, aldolase and anti-Jo1 -Do blood work for [employer] gold = Do blood work for hypersensitive pneumonitis panel - Do blood work for angiotensin-converting enzyme  -Get rid of feather pillow and blanket  Follow-up - Video visit Dr. Marchelle Gearing in the next 2-8 weeks to discuss results [do not have to wait for pulmonary function test to discuss the results]

## 2022-12-08 NOTE — Progress Notes (Signed)
OV 12/08/2022  Subjective:  Patient ID: Rachel Ferrell, female , DOB: 13-Aug-1980 , age 42 y.o. , MRN: [id number] , ADDRESS: [address] [place] [place] [address] PCP Shirline Frees, NP Patient Care Team: Shirline Frees, NP as PCP - General (Family Medicine)  This Provider for this visit: Treatment Team:  Attending Provider: Kalman Shan, MD    12/08/2022 -   Chief Complaint  Patient presents with   Consult    Consult for asthma, and lung scarring.     HPI Rachel Ferrell 42 y.o. -new consult referred by Dr. Madie Reno.  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  She actually reports for the first time in a long history of Raynaud's for several years.  However this has not been addressed in the past through her doctors..  She also has a history of recurrent diverticulitis against his background she says she carries diagnosis of asthma/allergy for the last 10 years although 3 skin test to be negative except the one recently done on the arm a few years ago was positive for mold.  She has been treated with Breo.  She feels it helps but despite all this she feels her symptoms are not well-controlled.  She says she has's cough.  This cough is present daily.  When she gets respiratory infections the cough gets worse and she will need steroids to address the symptoms.  But even at baseline she does have a cough.  She also has constant postnasal drip [she has a history of previous sinusitis addressed by Dr. Annalee Genta now retired].  She also reports wheezing although this is not something she feels subjectively.  She just states that when she goes to every physicians visit they can auscultate a wheeze [she did have squeaky wheeze in the lower lobes today on exam].  Her cough does wake her up at night at least 1 or 2 times every night.  No formal connective tissue diagnosis She has had COVID-vaccine she is also had COVID disease but no admissions for this Review  of system positive for Raynaud's for the last several years No family history of lung disease or autoimmune disease or premature graying No smoking no marijuana no cocaine no intravenous drug use She [place] single[place] for the last 70 years age of the home is 18 years.  She does have a pet hamster.  She does have longstanding use of pillow and blanket containing feathers  Detail organic and inorganic antigen history exposure is negative   FENO 14ppb on 12/08/2022 - aNORMA   IMAGING -all these images were visualized with the patient and I personally agree with these findings. - 2013 CT sinus: Acute maxillary sinusitis   - 2013 CT WO contrast: Atlectasis /scarring in lingular and RML - 2017 CTA: NO PE but has LLL PNA - Jan 2024 CXR: Clear - May 2024: ABd CT for diverticulits: ClearLower lungs       Latest Reference Range & Units 03/18/07 22:29 07/05/15 23:40 12/28/15 10:45 11/06/20 13:15 06/18/22 08:41 06/24/22 05:45 09/26/22 08:50 10/11/22 17:34  Creatinine 0.44 - 1.00 mg/dL 1.32 4.40 1.02 7.25 3.66 0.69 0.90 0.81     Latest Reference Range & Units 06/18/22 08:41 06/24/22 05:45 09/26/22 08:50  Eosinophils Absolute 0.0 - 0.7 K/uL 0.2 0.3 0.2     Latest Reference Range & Units 03/18/07 22:29 07/08/07 00:01 07/09/07 05:25 04/23/09 21:54 04/24/09 05:13  07/05/15 23:40 12/28/15 10:45 11/06/20 13:15 06/18/22 08:41 06/24/22 05:45 09/26/22 08:50 10/11/22 17:34  Hemoglobin 12.0 - 15.0 g/dL 16.1 (L) 09.6 04.5 (L) 12.2 11.5 (L) 13.4 14.3 12.8 15.2 (H) 13.5 15.0 13.6  (L): Data is abnormally low (H): Data is abnormally high    has a past medical history of ADHD, Asthma, Chronic sinusitis, Diverticulosis, GERD (gastroesophageal reflux disease), and Migraine.   reports that she has never smoked. She has never used smokeless tobacco.  Past Surgical History:  Procedure Laterality Date   ABLATION     2017- uterine to reduce periods   NASAL SEPTUM SURGERY      2012   REFRACTIVE SURGERY Bilateral    12/2021   SINUS IRRIGATION      No Known Allergies  Immunization History  Administered Date(s) Administered   Influenza Split 02/23/2009   Influenza Whole 05/10/2011   Tdap 08/08/2010, 06/18/2022    Family History  Problem Relation Age of Onset   Thyroid disease Mother    Hypertension Father    Dementia Father    Thyroid disease Father    Drug abuse Brother    Depression Brother    Alcohol abuse Brother    ADD / ADHD Son    Vision loss Maternal Grandmother    Dementia Maternal Grandmother    Dementia Paternal Grandmother    Cancer - Colon Neg Hx    Stomach cancer Neg Hx    Esophageal cancer Neg Hx    Pancreatic cancer Neg Hx    Colon cancer Neg Hx    Rectal cancer Neg Hx      Current Outpatient Medications:    albuterol (PROVENTIL HFA;VENTOLIN HFA) 108 (90 BASE) MCG/ACT inhaler, Inhale 2 puffs into the lungs every 6 (six) hours as needed for wheezing., Disp: , Rfl:    amphetamine-dextroamphetamine (ADDERALL XR) 10 MG 24 hr capsule, Take 1 capsule (10 mg total) by mouth daily., Disp: 30 capsule, Rfl: 0   amphetamine-dextroamphetamine (ADDERALL XR) 10 MG 24 hr capsule, Take 1 capsule (10 mg total) by mouth daily., Disp: 30 capsule, Rfl: 0   Cholecalciferol (VITAMIN D) 2000 units tablet, Take 2,000 Units by mouth See admin instructions. Take 1 tablet (2000 units) by mouth daily during winter months and 1 tablet (2000 units) every other day during summer months, Disp: , Rfl:    dicyclomine (BENTYL) 20 MG tablet, Take 1 tablet (20 mg total) by mouth every 6 (six) hours as needed (abdominal pain)., Disp: 30 tablet, Rfl: 0   fluconazole (DIFLUCAN) 150 MG tablet, Take one tablet and then take second tablet 3 days later if needed, Disp: 2 tablet, Rfl: 0   fluticasone (FLONASE) 50 MCG/ACT nasal spray, Place 1 spray into both nostrils at bedtime. , Disp: , Rfl:    fluticasone furoate-vilanterol (BREO ELLIPTA) 200-25 MCG/INH AEPB, Inhale 1 puff  into the lungs daily., Disp: , Rfl:    montelukast (SINGULAIR) 10 MG tablet, Take 10 mg by mouth at bedtime., Disp: , Rfl:    morphine (MSIR) 15 MG tablet, Take 0.5 tablets (7.5 mg total) by mouth every 4 (four) hours as needed for severe pain., Disp: 3 tablet, Rfl: 0   Multiple Vitamin (MULTIVITAMIN WITH MINERALS) TABS tablet, Take 2 tablets by mouth 2 (two) times daily. [place] brand, Disp: , Rfl:    ondansetron (ZOFRAN-ODT) 4 MG disintegrating tablet, 4mg  ODT q4 hours prn nausea/vomit, Disp: 20 tablet, Rfl: 0   amphetamine-dextroamphetamine (ADDERALL XR) 10 MG 24 hr capsule, Take 1 capsule (  10 mg total) by mouth daily., Disp: 30 capsule, Rfl: 0   Tiotropium Bromide Monohydrate (SPIRIVA RESPIMAT) 1.25 MCG/ACT AERS, Take 1.25 mg by mouth daily. Inhale 2 puffs by mouth daily (Patient not taking: Reported on 12/08/2022), Disp: , Rfl:       Objective:   Vitals:   12/08/22 0909  BP: 90/60  Pulse: 61  SpO2: 96%  Weight: 138 lb (62.6 kg)  Height: 5' 5.5" (1.664 m)    Estimated body mass index is 22.62 kg/m as calculated from the following:   Height as of this encounter: 5' 5.5" (1.664 m).   Weight as of this encounter: 138 lb (62.6 kg).  @WEIGHTCHANGE @  [employer]   12/08/22 0909  Weight: 138 lb (62.6 kg)     Physical Exam   General: No distress. Looks well O2 at rest: no Cane present: no Sitting in wheel chair: no Frail: no Obese: no Neuro: Alert and Oriented x 3. GCS 15. Speech normal Psych: Pleasant Resp:  Barrel Chest - no.  Wheeze - LOWER LOBE SQUEAKING wHEEZE, Crackles - no, No overt respiratory distress CVS: Normal heart sounds. Murmurs - no Ext: Stigmata of Connective Tissue Disease - no HEENT: Normal upper airway. PEERL +. No post nasal drip        Assessment:       ICD-10-CM   1. Chronic cough  R05.3     2. Wheezing  R06.2     3. Post-nasal drip  R09.82     4. Raynaud's phenomenon without gangrene  I73.00     5. History of asthma  Z87.09      6. History of sinusitis  Z87.09     7. Abnormal chest CT  R93.89          Plan:     Patient Instructions     ICD-10-CM   1. Chronic cough  R05.3     2. Wheezing  R06.2     3. Raynaud's phenomenon without gangrene  I73.00     4. History of asthma  Z87.09     5. History of sinusitis  Z87.09     6. Abnormal chest CT  R93.89       You appear to have Raynaud's You have a history of sinusiits  YOu also have cough and wheezing despite breo and singulair  Prior CXR/CT 2013  with lingular and Right Middle scarring and 2024 CT abdoment lung image with possible air trapping v mild inflammation in left lower lobe  There is also history of exposure to feather pillow and blanket  We need to understand all of this  Plan  - Do CT scan of the sinuses without contrast - Do high-resolution CT chest supine and prone inspiratory and expiratory phase volume -Do full pulmonary function test -Do blood work for ESR, ANA, double-stranded DNA, rheumatoid factor, CCP, SCL-70, SSA, SSB, CK, aldolase and anti-Jo1 -Do blood work for [employer] gold = Do blood work for hypersensitive pneumonitis panel - Do blood work for angiotensin-converting enzyme  -Get rid of feather pillow and blanket  Follow-up - Video visit Dr. Marchelle Gearing in the next 2-8 weeks to discuss results [do not have to wait for pulmonary function test to discuss the results]    SIGNATURE    Dr. Kalman Shan, M.D., F.C.C.P,  Pulmonary and Critical Care Medicine Staff Physician, [facility] Director - Interstitial Lung Disease  Program  Pulmonary Fibrosis [facility] at [facility] [address] [place], [place], [address]  Pager: [phone], If no answer or between  15:00h - 7:00h: call [phone] Telephone: [phone]  9:41 AM 12/08/2022

## 2022-12-09 LAB — RHEUMATOID FACTOR: Rheumatoid fact SerPl-aCnc: <10 IU/mL (ref <14)

## 2022-12-09 LAB — ANTI-DNA ANTIBODY, DOUBLE-STRANDED: ds DNA Ab: 1 IU/mL

## 2022-12-10 DIAGNOSIS — M25512 Pain in left shoulder: Secondary | ICD-10-CM | POA: Diagnosis not present

## 2022-12-10 DIAGNOSIS — M9901 Segmental and somatic dysfunction of cervical region: Secondary | ICD-10-CM | POA: Diagnosis not present

## 2022-12-10 LAB — QUANTIFERON-TB GOLD PLUS
Mitogen-NIL: >10 IU/mL
NIL: 0.03 IU/mL
QuantiFERON-TB Gold Plus: NEGATIVE
TB1-NIL: 0 IU/mL
TB2-NIL: 0 IU/mL

## 2022-12-10 LAB — ANGIOTENSIN CONVERTING ENZYME: Angiotensin-Converting Enzyme: 20 U/L (ref 9–67)

## 2022-12-10 LAB — ALDOLASE: Aldolase: 2.5 U/L (ref <=8.1)

## 2022-12-12 LAB — ANA+ENA+DNA/DS+SCL 70+SJOSSA/B
ANA Titer 1: NEGATIVE
ENA RNP Ab: <0.2 AI (ref 0.0–0.9)
ENA SM Ab Ser-aCnc: <0.2 AI (ref 0.0–0.9)
ENA SSA (RO) Ab: <0.2 AI (ref 0.0–0.9)
ENA SSB (LA) Ab: <0.2 AI (ref 0.0–0.9)
Scleroderma (Scl-70) (ENA) Antibody, IgG: <0.2 AI (ref 0.0–0.9)
dsDNA Ab: 1 IU/mL (ref 0–9)

## 2022-12-12 LAB — HYPERSENSITIVITY PNEUMONITIS
A. Pullulans Abs: NEGATIVE
A.Fumigatus #1 Abs: NEGATIVE
Micropolyspora faeni, IgG: NEGATIVE
Pigeon Serum Abs: NEGATIVE
Thermoact. Saccharii: NEGATIVE
Thermoactinomyces vulgaris, IgG: NEGATIVE

## 2022-12-12 LAB — ANTI-JO 1 ANTIBODY, IGG: Anti JO-1: <0.2 AI (ref 0.0–0.9)

## 2022-12-18 ENCOUNTER — Ambulatory Visit: Payer: Federal, State, Local not specified - PPO | Admitting: Clinical

## 2022-12-18 DIAGNOSIS — F419 Anxiety disorder, unspecified: Secondary | ICD-10-CM | POA: Diagnosis not present

## 2022-12-18 NOTE — Progress Notes (Signed)
Diagnosis: Anxiety NOS, F41.9 Session time: 2:03 pm- 3:00 pm CPT code: 16109U-04  Rachel Ferrell was seen in person for individual therapy. Session focused on continuing to process dynamics in her relationships, as well as events that had transpired on her children's sports teams. Therapist encouraged use of communication strategies, and encouraged Rachel Ferrell to continue checking in with herself as to what she really wants. She is scheduled to be seen again in 6 weeks, and will reach out if she needs to be seen sooner.   Objectives Related Problem: Rachel Ferrell experiences anxiety related to her son's well-being, as well as difficulty processing emotions relating to current situations and past traumas Description: Rachel Ferrell will develop strategies to help her regulate and process her emotions, including her anxiety and grief Target Date: 2022-12-08 Frequency: Biweekly Modality: individual Progress: 20% Planned Intervention: Therapist will help Rachel Ferrell to identify and disengage from negative thought patterns using CBT-based strategies  Planned Intervention: Therapist will provide Rachel Ferrell with referrals to additional resources as appropriate  Related Problem: Rachel Ferrell experiences anxiety related to her son's well-being, as well as difficulty processing emotions relating to current situations and past traumas Description: Rachel Ferrell will be provided with opportunities to process past traumatic experiences Target Date: 2022-12-08 Frequency: Daily Modality: individual Progress: 30% Planned Intervention: Therapist will provide Rachel Ferrell with opportunities to process her experiences in session   Treatment Plan Client Abilities/Strengths  Rachel Ferrell presented as insightful and motivated to improve her situation.  Client Treatment Preferences  Rachel Ferrell reported that she has found therapy most helpful in the past when she worked with a therapist who confronted and challenged her.  Client Statement of Needs  Rachel Ferrell is seeking CBT to help her  manage anxiety related to current stressors and past traumas.  Treatment Level  Biweekly  Symptoms  Anxiety: worried thoughts, difficulty relaxing, fears for the future, poor self-image, emerging social anxiety (Status: maintained). Depression: uncontrollable tearfulness, weight gain, emotional eating, depressed mood (Status: maintained).  Problems Addressed  New Description, New Description  Goals 1. Rachel Ferrell experiences anxiety related to her son's well-being, as well as difficulty processing emotions relating to current situations and past traumas Objective Rachel Ferrell will be provided with opportunities to process past traumatic experiences Target Date: 2022-12-08 Frequency: Daily  Progress: 30 Modality: individual  Related Interventions Therapist will help Rachel Ferrell to process her relationships with family members in order to better understand how she may be unwittingly repeating negative patterns from her past Therapist will provide Rachel Ferrell with opportunities to process her experiences in session Objective Rachel Ferrell will develop strategies to help her regulate and process her emotions, including her anxiety and grief Target Date: 2022-12-08 Frequency: Biweekly  Progress: 20 Modality: individual  Related Interventions Therapist will help Rachel Ferrell to identify and assert appropriate boundaries in her relationships, as well as provide her with communication strategies to help her communicate her thoughts and feelings Therapist will provide Rachel Ferrell with referrals to additional resources as appropriate Therapist will provide Rachel Ferrell with strategies to help her regulate her emotions, including deep breathing, meditation, mindfulness, and self-care Therapist will help Rachel Ferrell to identify and disengage from negative thought patterns using CBT-based strategies Therapist will engage Rachel Ferrell in behavior activation, including incorporation of pleasant and mastery events into her daily routine 2. Rachel Ferrell experiences grief and  depressive symptoms related to her father's passing and her challenging relationship with her mother Diagnosis Axis none 300.00 (Anxiety state, unspecified) - Open - [Signifier: n/a]    Conditions For Discharge Achievement of treatment goals and objectives    Roscoe Witts L  Dewayne Hatch, PhD               [name], PhD               [name], PhD

## 2022-12-26 ENCOUNTER — Ambulatory Visit (HOSPITAL_BASED_OUTPATIENT_CLINIC_OR_DEPARTMENT_OTHER)
Admission: RE | Admit: 2022-12-26 | Discharge: 2022-12-26 | Disposition: A | Discharge status: Home / Self Care | Payer: Federal, State, Local not specified - PPO | Source: Ambulatory Visit | Attending: Internal Medicine | Admitting: Internal Medicine

## 2022-12-26 DIAGNOSIS — Z8709 Personal history of other diseases of the respiratory system: Secondary | ICD-10-CM

## 2022-12-26 DIAGNOSIS — R918 Other nonspecific abnormal finding of lung field: Secondary | ICD-10-CM | POA: Diagnosis not present

## 2022-12-26 DIAGNOSIS — J329 Chronic sinusitis, unspecified: Secondary | ICD-10-CM | POA: Diagnosis not present

## 2022-12-26 DIAGNOSIS — R9389 Abnormal findings on diagnostic imaging of other specified body structures: Secondary | ICD-10-CM | POA: Diagnosis not present

## 2022-12-26 DIAGNOSIS — R590 Localized enlarged lymph nodes: Secondary | ICD-10-CM | POA: Diagnosis not present

## 2023-01-01 ENCOUNTER — Other Ambulatory Visit: Payer: Self-pay | Admitting: Adult Health

## 2023-01-01 DIAGNOSIS — F909 Attention-deficit hyperactivity disorder, unspecified type: Secondary | ICD-10-CM

## 2023-01-01 MED ORDER — AMPHETAMINE-DEXTROAMPHET ER 10 MG PO CP24
10.0000 mg | ORAL_CAPSULE | Freq: Every day | ORAL | 0 refills | Status: DC
Start: 2023-01-01 — End: 2024-01-27

## 2023-01-01 NOTE — Telephone Encounter (Signed)
Okay for refill?  

## 2023-01-08 ENCOUNTER — Ambulatory Visit: Payer: Federal, State, Local not specified - PPO | Admitting: Internal Medicine

## 2023-01-11 NOTE — Patient Instructions (Incomplete)
ICD-10-CM   1. Chronic cough  R05.3     2. Wheezing  R06.2     3. Post-nasal drip  R09.82     4. Raynaud's phenomenon without gangrene  I73.00     5. History of asthma  Z87.09     6. History of sinusitis  Z87.09     7. Abnormal chest CT  R93.89       You appear to have Raynaud's You have a history of sinusiits  YOu also have cough and wheezing despite breo and singulair  Prior CXR/CT 2013  with lingular and Right Middle scarring and 2024 CT abdoment lung image with possible air trapping v mild inflammation in left lower lobe  There is also history of exposure to feather pillow and blanket  We need to understand all of this  Plan  - Do CT scan of the sinuses without contrast - Do high-resolution CT chest supine and prone inspiratory and expiratory phase volume -Do full pulmonary function test -Do blood work for ESR, ANA, double-stranded DNA, rheumatoid factor, CCP, SCL-70, SSA, SSB, CK, aldolase and anti-Jo1 -Do blood work for [employer] gold = Do blood work for hypersensitive pneumonitis panel - Do blood work for angiotensin-converting enzyme  -Get rid of feather pillow and blanket  Follow-up - Video visit Dr. Marchelle Gearing in the next 2-8 weeks to discuss results [do not have to wait for pulmonary function test to discuss the results]

## 2023-01-11 NOTE — Progress Notes (Unsigned)
OV 12/08/2022  Subjective:  Patient ID: Rachel Ferrell, female , DOB: 07-03-1980 , age 42 y.o. , MRN: [id number] , ADDRESS: [address] [place] [address] PCP Shirline Frees, NP Patient Care Team: Shirline Frees, NP as PCP - General (Family Medicine)  This Provider for this visit: Treatment Team:  Attending Provider: Kalman Shan, MD    12/08/2022 -   Chief Complaint  Patient presents with   Consult    Consult for asthma, and lung scarring.     HPI Rachel Ferrell 42 y.o. -new consult referred by Dr. Madie Reno.  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  She actually reports for the first time in a long history of Raynaud's for several years.  However this has not been addressed in the past through her doctors..  She also has a history of recurrent diverticulitis against his background she says she carries diagnosis of asthma/allergy for the last 10 years although 3 skin test to be negative except the one recently done on the arm a few years ago was positive for mold.  She has been treated with Breo.  She feels it helps but despite all this she feels her symptoms are not well-controlled.  She says she has's cough.  This cough is present daily.  When she gets respiratory infections the cough gets worse and she will need steroids to address the symptoms.  But even at baseline she does have a cough.  She also has constant postnasal drip [she has a history of previous sinusitis addressed by Dr. Annalee Genta now retired].  She also reports wheezing although this is not something she feels subjectively.  She just states that when she goes to every physicians visit they can auscultate a wheeze [she did have squeaky wheeze in the lower lobes today on exam].  Her cough does wake her up at night at least 1 or 2 times every night.  No formal connective tissue diagnosis She has had COVID-vaccine she is also had COVID disease but no admissions for this Review of  system positive for Raynaud's for the last several years No family history of lung disease or autoimmune disease or premature graying No smoking no marijuana no cocaine no intravenous drug use She [place] for the last 19 years age of the home is 18 years.  She does have a pet hamster.  She does have longstanding use of pillow and blanket containing feathers  Detail organic and inorganic antigen history exposure is negative   FENO 14ppb on 12/08/2022 - aNORMA   IMAGING -all these images were visualized with the patient and I personally agree with these findings. - 2013 CT sinus: Acute maxillary sinusitis   - 2013 CT WO contrast: Atlectasis /scarring in lingular and RML - 2017 CTA: NO PE but has LLL PNA - Jan 2024 CXR: Clear - May 2024: ABd CT for diverticulits: ClearLower lungs   OV 01/12/2023  Subjective:  Patient ID: Rachel Ferrell, female , DOB: [birth year] , age 42 y.o. , MRN: [id number] , ADDRESS: [address] [place] [address] PCP Shirline Frees, NP Patient Care Team: Shirline Frees, NP as PCP - General (Family Medicine)  This Provider for this visit: Treatment Team:  Attending Provider: Kalman Shan, MD  Type of visit: Video Virtual Visit Identification of patient Rachel Ferrell with Oct 15, 1980 and MRN [id number] - 2 person identifier Risks: Risks, benefits, limitations  of telephone visit explained. Patient understood and verbalized agreement to proceed Anyone else on call: no Patient location: her home This provider location: [address]; [place]; [place] [address]. [facility]. [phone]    01/12/2023 -   Chief Complaint  Patient presents with   Follow-up    F/up Labs, CT scan, sinus CT, PFT.     HPI Rachel Ferrell 42 y.o. - here to review results. Interim Health status: No new complaints No new medical problems. No new surgeries. No ER visits. No Urgent care visits. No changes  to medications. Other than ongong cough and some increase in sinus drainage. CT sinus and CT chest - show post surgical sinus change ad air trapping respectively. Labs are normal:      PFT      No data to display            LAB RESULTS last 96 hours No results found.  LAB RESULTS last 90 days Recent Results (from the past 2160 hour(s))  POCT EXHALED NITRIC OXIDE     Status: Normal   Collection Time: 12/08/22  9:45 AM  Result Value Ref Range   FeNO level (ppb) 14   Angiotensin converting enzyme     Status: None   Collection Time: 12/08/22 10:00 AM  Result Value Ref Range   Angiotensin-Converting Enzyme 20 9 - 67 U/L  Hypersensitivity Pneumonitis     Status: None   Collection Time: 12/08/22 10:00 AM  Result Value Ref Range   A.Fumigatus #1 Abs Negative Negative   Micropolyspora faeni, IgG Negative Negative   Thermoactinomyces vulgaris, IgG Negative Negative   A. Pullulans Abs Negative Negative   Thermoact. Saccharii Negative Negative   Pigeon Serum Abs Negative Negative  QuantiFERON-TB Gold Plus     Status: None   Collection Time: 12/08/22 10:00 AM  Result Value Ref Range   QuantiFERON-TB Gold Plus NEGATIVE NEGATIVE    Comment: Negative test result. M. tuberculosis complex  infection unlikely.    NIL 0.03 IU/mL   Mitogen-NIL >10.00 IU/mL   TB1-NIL 0.00 IU/mL   TB2-NIL 0.00 IU/mL    Comment: . The Nil tube value reflects the background interferon gamma immune response of the patient's blood sample. This value has been subtracted from the patient's displayed TB and Mitogen results. . Lower than expected results with the Mitogen tube prevent false-negative Quantiferon readings by detecting a patient with a potential immune suppressive condition and/or suboptimal pre-analytical specimen handling. . The TB1 Antigen tube is coated with the M. tuberculosis-specific antigens designed to elicit responses from TB antigen primed CD4+ helper T-lymphocytes. . The  TB2 Antigen tube is coated with the M. tuberculosis-specific antigens designed to elicit responses from TB antigen primed CD4+ helper and CD8+ cytotoxic T-lymphocytes. . For additional information, please refer to https://education.questdiagnostics.com/faq/FAQ204 (This link is being provided for informational/ educational purposes only.) .   Angi-Jo 1 antibody, IgG     Status: None   Collection Time: 12/08/22 10:00 AM  Result Value Ref Range   Anti JO-1 <0.2 0.0 - 0.9 AI  Aldolase     Status: None   Collection Time: 12/08/22 10:00 AM  Result Value Ref Range   Aldolase 2.5 < OR = 8.1 U/L  Rheumatoid Factor     Status: None   Collection Time: 12/08/22 10:00 AM  Result Value Ref Range   Rheumatoid fact SerPl-aCnc <10 <14 IU/mL  Anti-DNA antibody, double-stranded     Status: None  Collection Time: 12/08/22 10:00 AM  Result Value Ref Range   ds DNA Ab 1 IU/mL    Comment:                            IU/mL       Interpretation                            < or = 4    Negative                            5-9         Indeterminate                            > or = 10   Positive .   ANA+ENA+DNA/DS+Scl 70+SjoSSA/B     Status: None   Collection Time: 12/08/22 10:00 AM  Result Value Ref Range   ANA Titer 1 Negative     Comment:                                      Negative   <1:80                                      Borderline  1:80                                      Positive   >1:80 ICAP nomenclature: AC-0 For more information about Hep-2 cell patterns use ANApatterns.org, the official website for the International Consensus on Antinuclear Antibody (ANA) Patterns (ICAP).    dsDNA Ab 1 0 - 9 IU/mL    Comment:                                    Negative      <5                                    Equivocal  5 - 9                                    Positive      >9    ENA RNP Ab <0.2 0.0 - 0.9 AI   ENA SM Ab Ser-aCnc <0.2 0.0 - 0.9 AI   Scleroderma (Scl-70) (ENA) Antibody, IgG  <0.2 0.0 - 0.9 AI   ENA SSA (RO) Ab <0.2 0.0 - 0.9 AI   ENA SSB (LA) Ab <0.2 0.0 - 0.9 AI  Sed Rate (ESR)     Status: None   Collection Time: 12/08/22 10:00 AM  Result Value Ref Range   Sed Rate 17 0 - 20 mm/hr         has a past medical history of ADHD, Asthma, Chronic sinusitis, Diverticulosis, GERD (gastroesophageal reflux disease), and Migraine.   reports that she has  never smoked. She has never used smokeless tobacco.  Past Surgical History:  Procedure Laterality Date   ABLATION     2017- uterine to reduce periods   NASAL SEPTUM SURGERY     2012   REFRACTIVE SURGERY Bilateral    12/2021   SINUS IRRIGATION      No Known Allergies  Immunization History  Administered Date(s) Administered   Influenza Split 02/23/2009   Influenza Whole 05/10/2011   Tdap 08/08/2010, 06/18/2022    Family History  Problem Relation Age of Onset   Thyroid disease Mother    Hypertension Father    Dementia Father    Thyroid disease Father    Drug abuse Brother    Depression Brother    Alcohol abuse Brother    ADD / ADHD Son    Vision loss Maternal Grandmother    Dementia Maternal Grandmother    Dementia Paternal Grandmother    Cancer - Colon Neg Hx    Stomach cancer Neg Hx    Esophageal cancer Neg Hx    Pancreatic cancer Neg Hx    Colon cancer Neg Hx    Rectal cancer Neg Hx      Current Outpatient Medications:    albuterol (PROVENTIL HFA;VENTOLIN HFA) 108 (90 BASE) MCG/ACT inhaler, Inhale 2 puffs into the lungs every 6 (six) hours as needed for wheezing., Disp: , Rfl:    amphetamine-dextroamphetamine (ADDERALL XR) 10 MG 24 hr capsule, Take 1 capsule (10 mg total) by mouth daily., Disp: 30 capsule, Rfl: 0   amphetamine-dextroamphetamine (ADDERALL XR) 10 MG 24 hr capsule, Take 1 capsule (10 mg total) by mouth daily., Disp: 30 capsule, Rfl: 0   amphetamine-dextroamphetamine (ADDERALL XR) 10 MG 24 hr capsule, Take 1 capsule (10 mg total) by mouth daily., Disp: 30 capsule, Rfl: 0    Cholecalciferol (VITAMIN D) 2000 units tablet, Take 2,000 Units by mouth See admin instructions. Take 1 tablet (2000 units) by mouth daily during winter months and 1 tablet (2000 units) every other day during summer months, Disp: , Rfl:    fluticasone (FLONASE) 50 MCG/ACT nasal spray, Place 1 spray into both nostrils at bedtime. , Disp: , Rfl:    fluticasone furoate-vilanterol (BREO ELLIPTA) 200-25 MCG/INH AEPB, Inhale 1 puff into the lungs daily., Disp: , Rfl:    ipratropium (ATROVENT) 0.03 % nasal spray, Place 2 sprays into both nostrils every 12 (twelve) hours., Disp: 30 mL, Rfl: 4   montelukast (SINGULAIR) 10 MG tablet, Take 10 mg by mouth at bedtime., Disp: , Rfl:    Multiple Vitamin (MULTIVITAMIN WITH MINERALS) TABS tablet, Take 2 tablets by mouth 2 (two) times daily. [place] brand, Disp: , Rfl:    dicyclomine (BENTYL) 20 MG tablet, Take 1 tablet (20 mg total) by mouth every 6 (six) hours as needed (abdominal pain). (Patient not taking: Reported on 01/12/2023), Disp: 30 tablet, Rfl: 0   fluconazole (DIFLUCAN) 150 MG tablet, Take one tablet and then take second tablet 3 days later if needed (Patient not taking: Reported on 01/12/2023), Disp: 2 tablet, Rfl: 0   morphine (MSIR) 15 MG tablet, Take 0.5 tablets (7.5 mg total) by mouth every 4 (four) hours as needed for severe pain. (Patient not taking: Reported on 01/12/2023), Disp: 3 tablet, Rfl: 0   ondansetron (ZOFRAN-ODT) 4 MG disintegrating tablet, 4mg  ODT q4 hours prn nausea/vomit (Patient not taking: Reported on 01/12/2023), Disp: 20 tablet, Rfl: 0   Tiotropium Bromide Monohydrate (SPIRIVA RESPIMAT) 1.25 MCG/ACT AERS, Take 1.25 mg by mouth daily.  Inhale 2 puffs by mouth daily (Patient not taking: Reported on 01/12/2023), Disp: , Rfl:       Objective:   Vitals:   01/12/23 1133  BP: 102/60  Pulse: 84  Weight: 138 lb (62.6 kg)  Height: 5\' 5"  (1.651 m)    Estimated body mass index is 22.96 kg/m as calculated from the following:   Height as  of this encounter: 5\' 5"  (1.651 m).   Weight as of this encounter: 138 lb (62.6 kg).  @WEIGHTCHANGE @  [employer]   01/12/23 1133  Weight: 138 lb (62.6 kg)     Physical Exam   General: No distress. Looks well O2 at rest: ra Cane present: no Sitting in wheel chair: no Frail: no Obese: no Neuro: Alert and Oriented x 3. GCS 15. Speech normal Psych: Pleasant       Assessment:       ICD-10-CM   1. Chronic cough  R05.3 Ambulatory referral to ENT    2. History of asthma  Z87.09     3. Post-nasal drip  R09.82     4. History of sinusitis  Z87.09      Side effects of gabapentin explained in detail    Plan:     Patient Instructions     ICD-10-CM   1. Chronic cough  R05.3     2. History of asthma  Z87.09     3. Post-nasal drip  R09.82     4. History of sinusitis  Z87.09      Chronic cough Irritable larynx syndrome  - other than asthma and sinus drainage there is out of proportion cough and will call this cough neuropathy or chronic refractory cough -Blood work and CT chest are normal   Plan  - Choose any 2 consecutive days and to complete voice rest without any talking of the spring [use only text message for communication] -Anytime you have an urge to cough please drink water or uses sugarless lozenge -Referral [facility] for voice rehabilitation exercises -Also start gabapentin as follows   = Take gabapentin 300mg  once daily x 7 days, then 300mg  twice daily x 7 days, then 300mg  three times daily to continue. If this makes you too sleepy or drowsy call us and we will cut your medication dosing down -Ensure that you have gotten rid of the feather pillow   History of asthma  -Appears well-controlled  Plan - Continue Breo and allergy treatment under Dr. Madie Reno  Post-nasal drip History of sinusitis  -This is currently significant and if this is not well-controlled I doubt cough neuropathy will resolve with just gabapentin  Plan -  Continue Flonase - Add Atrovent nasal spray 2 squirts daily -Refer to ENT  Follow-up -6 weeks with myself or nurse practitioner for the video visit or face-to-face visit - Video visit Dr. Marchelle Gearing in the next 2-8 weeks to discuss results [do not have to wait for pulmonary function test to discuss the results]   FOLLOWUP Return in about 6 weeks (around 02/23/2023) for 15 min visit, chronic cough, Face to Face OR Video Visit, with Dr Marchelle Gearing, with any of the [facility].    SIGNATURE    Dr. Kalman Shan, M.D., F.C.C.P,  Pulmonary and Critical Care Medicine Staff Physician, [facility] Center Director - Interstitial Lung Disease  Program  Pulmonary Fibrosis [facility] at [facility] [place], [place], [address]  Pager: [phone], If no answer or between  15:00h - 7:00h: call [phone]  [id number] Telephone: [phone]  12:57 PM 01/12/2023

## 2023-01-12 ENCOUNTER — Telehealth: Payer: Federal, State, Local not specified - PPO | Admitting: Internal Medicine

## 2023-01-12 ENCOUNTER — Ambulatory Visit: Payer: Federal, State, Local not specified - PPO | Admitting: Gastroenterology

## 2023-01-12 ENCOUNTER — Encounter: Payer: Self-pay | Admitting: Internal Medicine

## 2023-01-12 ENCOUNTER — Encounter: Payer: Self-pay | Admitting: Gastroenterology

## 2023-01-12 VITALS — BP 102/60 | HR 84 | Ht 65.0 in | Wt 138.0 lb

## 2023-01-12 DIAGNOSIS — Z8719 Personal history of other diseases of the digestive system: Secondary | ICD-10-CM | POA: Diagnosis not present

## 2023-01-12 DIAGNOSIS — Z8709 Personal history of other diseases of the respiratory system: Secondary | ICD-10-CM | POA: Diagnosis not present

## 2023-01-12 DIAGNOSIS — Z86010 Personal history of colonic polyps: Secondary | ICD-10-CM

## 2023-01-12 DIAGNOSIS — R0982 Postnasal drip: Secondary | ICD-10-CM | POA: Diagnosis not present

## 2023-01-12 DIAGNOSIS — R053 Chronic cough: Secondary | ICD-10-CM

## 2023-01-12 MED ORDER — IPRATROPIUM BROMIDE 0.03 % NA SOLN: 2.0000 | Freq: Two times a day (BID) | NASAL | 4 refills | Status: AC

## 2023-01-12 NOTE — Progress Notes (Unsigned)
HPI : Rachel Ferrell is a very pleasant 42 year old female with a history of asthma and uncomplicated diverticulitis who I initially saw in July 2022 following her diverticulitis episode in May 2022.  Her symptoms had resolved and she underwent a colonoscopy in September 2022 which showed a 10 mm sessile serrated adenoma in the ascending colon, a few small hyperplastic polyps and left sided diverticulosis.  She was recommended to repeat colonoscopy in 3 years.   She was last seen in the office Sept 2023 with symptoms of abdominal discomfort/bloating/distention.  Testing for celiac disease, H. Pylori and thyroid disease were unremarkable. She was advised to try bentyl, IBGard and to limit gas-producing foods (legumes, cruciferous vegetables).  She was recently seen by pulmonology for asthma and concern for possible underlying autoimmune disease; work up negative for autoimmune labs/inflammatory markers.  Normal CT chest and sinuses.  She had 2 episodes consistent with her diverticulitis symptoms, 1 in January and another extended episode lasting several weeks and April.  She was given oral antibiotics in January and her symptoms resolved within a few days. With the episode in April, she continued to have abdominal pain, nausea and diarrhea despite oral Augmentin, and she eventually went to the emergency department where a CT scan again showed evidence of complicated left-sided diverticulitis.  She was given 1 dose of IV ciprofloxacin and Flagyl and discharged with 10 days of oral Cipro/Flagyl.  Her symptoms improved shortly after this, and she has been doing well since then.  She reports making some dietary changes in the past few months to include eating healthier, limiting carbs, fried food and soda and limiting caloric intake to 1300 cal/day. She did try taking psyllium powder, but this made her feel constipated.  She is taking probiotics daily and she thinks this helps. She is having regular bowel  movements and denies problems with constipation or diarrhea.    Colonoscopy September 2022:  10 mm sessile serrated adenoma, multiple small left-sided hyperplastic polyps, left-sided diverticulosis: Repeat colonoscopy 3 years   Past Medical History:  Diagnosis Date   ADHD    Asthma    Chronic sinusitis    Diverticulosis    GERD (gastroesophageal reflux disease)    Migraine      Past Surgical History:  Procedure Laterality Date   ABLATION     2017- uterine to reduce periods   NASAL SEPTUM SURGERY     2012   REFRACTIVE SURGERY Bilateral    12/2021   SINUS IRRIGATION     Family History  Problem Relation Age of Onset   Thyroid disease Mother    Hypertension Father    Dementia Father    Thyroid disease Father    Drug abuse Brother    Depression Brother    Alcohol abuse Brother    ADD / ADHD Son    Vision loss Maternal Grandmother    Dementia Maternal Grandmother    Dementia Paternal Grandmother    Cancer - Colon Neg Hx    Stomach cancer Neg Hx    Esophageal cancer Neg Hx    Pancreatic cancer Neg Hx    Colon cancer Neg Hx    Rectal cancer Neg Hx    Social History   Tobacco Use   Smoking status: Never   Smokeless tobacco: Never  Vaping Use   Vaping status: Never Used  Substance Use Topics   Alcohol use: No   Drug use: No   Current Outpatient Medications  Medication Sig Dispense  Refill   albuterol (PROVENTIL HFA;VENTOLIN HFA) 108 (90 BASE) MCG/ACT inhaler Inhale 2 puffs into the lungs every 6 (six) hours as needed for wheezing.     amphetamine-dextroamphetamine (ADDERALL XR) 10 MG 24 hr capsule Take 1 capsule (10 mg total) by mouth daily. 30 capsule 0   amphetamine-dextroamphetamine (ADDERALL XR) 10 MG 24 hr capsule Take 1 capsule (10 mg total) by mouth daily. 30 capsule 0   amphetamine-dextroamphetamine (ADDERALL XR) 10 MG 24 hr capsule Take 1 capsule (10 mg total) by mouth daily. 30 capsule 0   Cholecalciferol (VITAMIN D) 2000 units tablet Take 2,000 Units by  mouth See admin instructions. Take 1 tablet (2000 units) by mouth daily during winter months and 1 tablet (2000 units) every other day during summer months     dicyclomine (BENTYL) 20 MG tablet Take 1 tablet (20 mg total) by mouth every 6 (six) hours as needed (abdominal pain). 30 tablet 0   fluconazole (DIFLUCAN) 150 MG tablet Take one tablet and then take second tablet 3 days later if needed 2 tablet 0   fluticasone (FLONASE) 50 MCG/ACT nasal spray Place 1 spray into both nostrils at bedtime.      fluticasone furoate-vilanterol (BREO ELLIPTA) 200-25 MCG/INH AEPB Inhale 1 puff into the lungs daily.     montelukast (SINGULAIR) 10 MG tablet Take 10 mg by mouth at bedtime.     morphine (MSIR) 15 MG tablet Take 0.5 tablets (7.5 mg total) by mouth every 4 (four) hours as needed for severe pain. 3 tablet 0   Multiple Vitamin (MULTIVITAMIN WITH MINERALS) TABS tablet Take 2 tablets by mouth 2 (two) times daily. [employer] brand     ondansetron (ZOFRAN-ODT) 4 MG disintegrating tablet 4mg  ODT q4 hours prn nausea/vomit 20 tablet 0   Tiotropium Bromide Monohydrate (SPIRIVA RESPIMAT) 1.25 MCG/ACT AERS Take 1.25 mg by mouth daily. Inhale 2 puffs by mouth daily (Patient not taking: Reported on 12/08/2022)     No current facility-administered medications for this visit.   No Known Allergies   Review of Systems: All systems reviewed and negative except where noted in HPI.    CT Chest High Resolution  Result Date: 01/04/2023 CLINICAL DATA:  42 year old female with history of sinusitis. Abnormal chest CT. Follow-up study. EXAM: CT CHEST WITHOUT CONTRAST TECHNIQUE: Multidetector CT imaging of the chest was performed following the standard protocol without intravenous contrast. High resolution imaging of the lungs, as well as inspiratory and expiratory imaging, was performed. RADIATION DOSE REDUCTION: This exam was performed according to the departmental dose-optimization program which includes automated exposure  control, adjustment of the mA and/or kV according to patient size and/or use of iterative reconstruction technique. COMPARISON:  Chest CTA 07/06/2015. FINDINGS: Cardiovascular: Heart size is normal. There is no significant pericardial fluid, thickening or pericardial calcification. No atherosclerotic calcifications are noted in the thoracic aorta or the coronary arteries. Mediastinum/Nodes: No pathologically enlarged mediastinal or hilar lymph nodes. Please note that accurate exclusion of hilar adenopathy is limited on noncontrast CT scans. Esophagus is unremarkable in appearance. No axillary lymphadenopathy. Lungs/Pleura: High-resolution images demonstrate no significant regions of ground-glass attenuation, septal thickening, subpleural reticulation, parenchymal banding, traction bronchiectasis or frank honeycombing to indicate interstitial lung disease. Inspiratory and expiratory imaging demonstrates some mild air trapping indicative of mild small airways disease. No acute consolidative airspace disease. No pleural effusions. No suspicious appearing pulmonary nodules or masses are noted. Upper Abdomen: Unremarkable. Musculoskeletal: There are no aggressive appearing lytic or blastic lesions noted in the visualized portions  of the skeleton. IMPRESSION: 1. No evidence of interstitial lung disease. 2. Mild air trapping indicative of mild small airways disease. Electronically Signed   By: Trudie Reed M.D.   On: 01/04/2023 12:14   CT MAXILLOFACIAL WO CONTRAST  Result Date: 12/26/2022 CLINICAL DATA:  Sinusitis. EXAM: CT MAXILLOFACIAL WITHOUT CONTRAST TECHNIQUE: Multidetector CT imaging of the maxillofacial structures was performed. Multiplanar CT image reconstructions were also generated. RADIATION DOSE REDUCTION: This exam was performed according to the departmental dose-optimization program which includes automated exposure control, adjustment of the mA and/or kV according to patient size and/or use of  iterative reconstruction technique. COMPARISON:  None Available. FINDINGS: Paranasal sinuses: Frontal: Normally aerated. Patent frontal sinus drainage pathways. Ethmoid: Normally aerated. Postsurgical changes are seen in the anterior ethmoid air cells. Maxillary: Normally aerated. Patient is status post sinonasal surgery bilaterally. Sphenoid: Normally aerated. Patent sphenoethmoidal recesses. Right ostiomeatal unit: Patent. Left ostiomeatal unit: Patent. Nasal passages: Patent. Intact nasal septum is midline. Other: Orbits and intracranial compartment are unremarkable. Visible mastoid air cells are normally aerated. IMPRESSION: 1. Normally aerated paranasal sinuses. Patent sinus drainage pathways. 2. Postsurgical changes in the anterior ethmoid air cells and maxillary sinuses. Electronically Signed   By: Darliss Cheney M.D.   On: 12/26/2022 16:36    Physical Exam: BP 102/60   Pulse 84   Ht 5\' 5"  (1.651 m)   Wt 138 lb (62.6 kg)   BMI 22.96 kg/m  Constitutional: Pleasant,well-developed, Caucasian female in no acute distress. HEENT: Normocephalic and atraumatic. Conjunctivae are normal. No scleral icterus. Cardiovascular: Normal rate, regular rhythm.  Pulmonary/chest: Effort normal.  Inspiratory and expiratory wheezing Abdominal: Soft, nondistended, nontender. Bowel sounds active throughout. There are no masses palpable. No hepatomegaly. Neurological: Alert and oriented to person place and time. Skin: Skin is warm and dry. No rashes noted. Psychiatric: Normal mood and affect. Behavior is normal.  CBC    Component Value Date/Time   WBC 8.5 10/11/2022 1734   RBC 4.62 10/11/2022 1734   HGB 13.6 10/11/2022 1734   HCT 41.9 10/11/2022 1734   PLT 277 10/11/2022 1734   MCV 90.7 10/11/2022 1734   MCH 29.4 10/11/2022 1734   MCHC 32.5 10/11/2022 1734   RDW 13.9 10/11/2022 1734   LYMPHSABS 1.9 09/26/2022 0850   MONOABS 0.7 09/26/2022 0850   EOSABS 0.2 09/26/2022 0850   BASOSABS 0.2 (H) 09/26/2022  0850    CMP     Component Value Date/Time   NA 139 10/11/2022 1734   K 4.1 10/11/2022 1734   CL 105 10/11/2022 1734   CO2 26 10/11/2022 1734   GLUCOSE 88 10/11/2022 1734   BUN 17 10/11/2022 1734   CREATININE 0.81 10/11/2022 1734   CALCIUM 9.8 10/11/2022 1734   PROT 6.9 10/11/2022 1734   ALBUMIN 4.2 10/11/2022 1734   AST 14 (L) 10/11/2022 1734   ALT 10 10/11/2022 1734   ALKPHOS 46 10/11/2022 1734   BILITOT 0.4 10/11/2022 1734   GFRNONAA >60 10/11/2022 1734   GFRAA >60 12/28/2015 1045       Latest Ref Rng & Units 10/11/2022    5:34 PM 09/26/2022    8:50 AM 06/24/2022    5:45 AM  CBC EXTENDED  WBC 4.0 - 10.5 K/uL 8.5  12.8  13.9   RBC 3.87 - 5.11 MIL/uL 4.62  4.98  4.52   Hemoglobin 12.0 - 15.0 g/dL 29.5  28.4  13.2   HCT 36.0 - 46.0 % 41.9  44.8  39.9   Platelets 150 -  400 K/uL 277  289.0  238   NEUT# 1.4 - 7.7 K/uL  9.9  11.0   Lymph# 0.7 - 4.0 K/uL  1.9  1.6       ASSESSMENT AND PLAN: 42 year old female with multiple episodes of uncomplicated left-sided diverticulitis.  She is been asymptomatic since her last episode in May.  We again discussed management of chronic recurrent diverticulitis and the lack of good evidence on how to prevent future episodes.  I do think that her dietary improvements (increasing natural dietary fiber and limiting processed foods/carbs sugars) is likely to help.  She did not tolerate a fiber supplement.  We discussed the role of surgery for recurrent diverticulitis and how prophylactic surgery is no longer recommended for uncomplicated diverticulitis. Although there is some underlying concern for an autoimmune disorder based on her history of sinus disease and pulmonary disease, I think it is very unlikely the patient has any sort of underlying autoimmune gastrointestinal disorder such as Crohn's disease.  Her symptoms, imaging and colonoscopy are all very consistent with recurrent uncomplicated diverticulitis. She will be due for polyp surveillance  next September.  Recurrent uncomplicated diverticulitis - Continue current diet, probiotics - No need for surgery - Avoid NSAIDs  History of colon polyp - Colonoscopy Sept 2025  Shirline Frees, NP

## 2023-01-12 NOTE — Patient Instructions (Addendum)
_______________________________________________________  If your blood pressure at your visit was 140/90 or greater, please contact your primary care physician to follow up on this.  _______________________________________________________  If you are age 42 or older, your body mass index should be between 23-30. Your Body mass index is 22.96 kg/m. If this is out of the aforementioned range listed, please consider follow up with your Primary Care Provider.  If you are age 38 or younger, your body mass index should be between 19-25. Your Body mass index is 22.96 kg/m. If this is out of the aformentioned range listed, please consider follow up with your Primary Care Provider.    The [facility] providers would like to encourage you to use [facility] to communicate with providers for non-urgent requests or questions.  Due to long hold times on the telephone, sending your provider a message by [facility] may be a faster and more efficient way to get a response.  Please allow 48 business hours for a response.  Please remember that this is for non-urgent requests.   It was a pleasure to see you today!  Thank you for trusting me with your gastrointestinal care!

## 2023-01-30 DIAGNOSIS — Z1231 Encounter for screening mammogram for malignant neoplasm of breast: Secondary | ICD-10-CM | POA: Diagnosis not present

## 2023-01-30 DIAGNOSIS — Z01411 Encounter for gynecological examination (general) (routine) with abnormal findings: Secondary | ICD-10-CM | POA: Diagnosis not present

## 2023-01-30 DIAGNOSIS — Z113 Encounter for screening for infections with a predominantly sexual mode of transmission: Secondary | ICD-10-CM | POA: Diagnosis not present

## 2023-01-30 DIAGNOSIS — Z124 Encounter for screening for malignant neoplasm of cervix: Secondary | ICD-10-CM | POA: Diagnosis not present

## 2023-01-30 DIAGNOSIS — Z01419 Encounter for gynecological examination (general) (routine) without abnormal findings: Secondary | ICD-10-CM | POA: Diagnosis not present

## 2023-01-30 LAB — HM MAMMOGRAPHY

## 2023-02-03 ENCOUNTER — Ambulatory Visit: Payer: Federal, State, Local not specified - PPO | Admitting: Clinical

## 2023-02-03 DIAGNOSIS — F419 Anxiety disorder, unspecified: Secondary | ICD-10-CM | POA: Diagnosis not present

## 2023-02-03 NOTE — Progress Notes (Signed)
Diagnosis: Anxiety NOS, F41.9 Session time: 2:03 pm- 3:00 pm CPT code: [id number]  Rachel Ferrell was seen in person for individual therapy. Session focused on processing recent events, including having learned that her boss has been fired. Therapist encouraged Rachel Ferrell to look into parallels between dynamics in her current relationships and those in relationships from her childhood, and she pointed out several instances of struggling to advocate for her own needs, tending instead to accommodate the needs of others. For homework, she will consider how she might be able to be less accommodating. She is scheduled to be seen again in two weeks.   Objectives Related Problem: [name] experiences anxiety related to her son's well-being, as well as difficulty processing emotions relating to current situations and past traumas Description: Rachel Ferrell will develop strategies to help her regulate and process her emotions, including her anxiety and grief Target Date: 2023-12-08 Frequency: Biweekly Modality: individual Progress: 20% Planned Intervention: Therapist will help Rachel Ferrell to identify and disengage from negative thought patterns using CBT-based strategies  Planned Intervention: Therapist will provide Rachel Ferrell with referrals to additional resources as appropriate  Related Problem: Rachel Ferrell experiences anxiety related to her son's well-being, as well as difficulty processing emotions relating to current situations and past traumas Description: Rachel Ferrell will be provided with opportunities to process past traumatic experiences Target Date: 2023-12-08 Frequency: Daily Modality: individual Progress: 30% Planned Intervention: Therapist will provide Rachel Ferrell with opportunities to process her experiences in session   Treatment Plan Client Abilities/Strengths  Rachel Ferrell presented as insightful and motivated to improve her situation.  Client Treatment Preferences  Rachel Ferrell reported that she has found therapy most helpful in the past when she  worked with a therapist who confronted and challenged her.  Client Statement of Needs  Rachel Ferrell is seeking CBT to help her manage anxiety related to current stressors and past traumas.  Treatment Level  Biweekly  Symptoms  Anxiety: worried thoughts, difficulty relaxing, fears for the future, poor self-image, emerging social anxiety (Status: maintained). Depression: uncontrollable tearfulness, weight gain, emotional eating, depressed mood (Status: maintained).  Problems Addressed  New Description, New Description  Goals 1. Rachel Ferrell experiences anxiety related to her son's well-being, as well as difficulty processing emotions relating to current situations and past traumas Objective Rachel Ferrell will be provided with opportunities to process past traumatic experiences Target Date: 2023-12-08 Frequency: Daily  Progress: 30 Modality: individual  Related Interventions Therapist will help Rachel Ferrell to process her relationships with family members in order to better understand how she may be unwittingly repeating negative patterns from her past Therapist will provide Rachel Ferrell with opportunities to process her experiences in session Objective Rachel Ferrell will develop strategies to help her regulate and process her emotions, including her anxiety and grief Target Date: 2023-12-08 Frequency: Biweekly  Progress: 20 Modality: individual  Related Interventions Therapist will help Rachel Ferrell to identify and assert appropriate boundaries in her relationships, as well as provide her with communication strategies to help her communicate her thoughts and feelings Therapist will provide Rachel Ferrell with referrals to additional resources as appropriate Therapist will provide Rachel Ferrell with strategies to help her regulate her emotions, including deep breathing, meditation, mindfulness, and self-care Therapist will help Rachel Ferrell to identify and disengage from negative thought patterns using CBT-based strategies Therapist will engage Rachel Ferrell in behavior  activation, including incorporation of pleasant and mastery events into her daily routine 2. Rachel Ferrell experiences grief and depressive symptoms related to her father's passing and her challenging relationship with her mother Diagnosis Axis none 300.00 (Anxiety state, unspecified) - Open - [Signifier: n/a]  Conditions For Discharge Achievement of treatment goals and objectives    Rachel Noa, PhD               Rachel Noa, PhD               Rachel Noa, PhD               Rachel Noa, PhD

## 2023-02-16 ENCOUNTER — Ambulatory Visit (INDEPENDENT_AMBULATORY_CARE_PROVIDER_SITE_OTHER): Payer: Federal, State, Local not specified - PPO | Admitting: Clinical

## 2023-02-16 DIAGNOSIS — F419 Anxiety disorder, unspecified: Secondary | ICD-10-CM
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
## 2023-03-05 ENCOUNTER — Ambulatory Visit: Payer: Federal, State, Local not specified - PPO | Admitting: Clinical

## 2023-03-05 DIAGNOSIS — F419 Anxiety disorder, unspecified: Secondary | ICD-10-CM
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
## 2023-03-18 ENCOUNTER — Ambulatory Visit (INDEPENDENT_AMBULATORY_CARE_PROVIDER_SITE_OTHER): Payer: Federal, State, Local not specified - PPO | Admitting: Clinical

## 2023-03-18 DIAGNOSIS — F419 Anxiety disorder, unspecified: Secondary | ICD-10-CM
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
## 2023-04-20 DIAGNOSIS — M25512 Pain in left shoulder: Secondary | ICD-10-CM | POA: Diagnosis not present

## 2023-04-20 DIAGNOSIS — M9901 Segmental and somatic dysfunction of cervical region: Secondary | ICD-10-CM | POA: Diagnosis not present

## 2023-04-23 ENCOUNTER — Telehealth: Payer: Self-pay

## 2023-04-23 ENCOUNTER — Other Ambulatory Visit (HOSPITAL_COMMUNITY): Payer: Self-pay

## 2023-04-23 NOTE — Telephone Encounter (Signed)
Pharmacy Patient Advocate Encounter   Received notification from CoverMyMeds that prior authorization for Amphetamine-Dextroamphet ER 10MG  er capsules is required/requested.   Insurance verification completed.   The patient is insured through [facility] .   Per test claim: PA required; PA submitted to above mentioned insurance via CoverMyMeds Key/confirmation #/EOC Key: [id number]  Status is pending

## 2023-04-24 ENCOUNTER — Other Ambulatory Visit (HOSPITAL_COMMUNITY): Payer: Self-pay

## 2023-04-24 NOTE — Telephone Encounter (Signed)
Pharmacy Patient Advocate Encounter  Received notification from [facility] that Prior Authorization for Amphetamine-Dextroamphet ER 10MG  er capsules has been APPROVED from 04/23/2023 to 04/22/2024. Ran test claim, Copay is $7.50. This test claim was processed through [facility]- copay amounts may vary at other pharmacies due to pharmacy/plan contracts, or as the patient moves through the different stages of their insurance plan.   PA #/Case ID/Reference #: [id number]

## 2023-04-27 DIAGNOSIS — M25512 Pain in left shoulder: Secondary | ICD-10-CM | POA: Diagnosis not present

## 2023-04-27 DIAGNOSIS — M9901 Segmental and somatic dysfunction of cervical region: Secondary | ICD-10-CM | POA: Diagnosis not present

## 2023-04-29 ENCOUNTER — Ambulatory Visit: Payer: Federal, State, Local not specified - PPO | Admitting: Clinical

## 2023-04-30 DIAGNOSIS — M9901 Segmental and somatic dysfunction of cervical region: Secondary | ICD-10-CM | POA: Diagnosis not present

## 2023-04-30 DIAGNOSIS — M25512 Pain in left shoulder: Secondary | ICD-10-CM | POA: Diagnosis not present

## 2023-05-04 DIAGNOSIS — M25512 Pain in left shoulder: Secondary | ICD-10-CM | POA: Diagnosis not present

## 2023-05-04 DIAGNOSIS — M9901 Segmental and somatic dysfunction of cervical region: Secondary | ICD-10-CM | POA: Diagnosis not present

## 2023-05-11 DIAGNOSIS — M25512 Pain in left shoulder: Secondary | ICD-10-CM | POA: Diagnosis not present

## 2023-05-11 DIAGNOSIS — M9901 Segmental and somatic dysfunction of cervical region: Secondary | ICD-10-CM | POA: Diagnosis not present

## 2023-05-14 ENCOUNTER — Ambulatory Visit: Payer: Federal, State, Local not specified - PPO | Admitting: Clinical

## 2023-05-14 DIAGNOSIS — F419 Anxiety disorder, unspecified: Secondary | ICD-10-CM | POA: Diagnosis not present
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
## 2023-05-25 DIAGNOSIS — M9901 Segmental and somatic dysfunction of cervical region: Secondary | ICD-10-CM | POA: Diagnosis not present

## 2023-05-25 DIAGNOSIS — M25512 Pain in left shoulder: Secondary | ICD-10-CM | POA: Diagnosis not present

## 2023-05-27 DIAGNOSIS — K219 Gastro-esophageal reflux disease without esophagitis: Secondary | ICD-10-CM | POA: Diagnosis not present

## 2023-05-27 DIAGNOSIS — J3089 Other allergic rhinitis: Secondary | ICD-10-CM | POA: Diagnosis not present

## 2023-05-27 DIAGNOSIS — B999 Unspecified infectious disease: Secondary | ICD-10-CM | POA: Diagnosis not present

## 2023-05-27 DIAGNOSIS — J4550 Severe persistent asthma, uncomplicated: Secondary | ICD-10-CM | POA: Diagnosis not present

## 2023-06-26 ENCOUNTER — Ambulatory Visit (INDEPENDENT_AMBULATORY_CARE_PROVIDER_SITE_OTHER): Payer: Federal, State, Local not specified - PPO | Admitting: Clinical

## 2023-06-26 DIAGNOSIS — F419 Anxiety disorder, unspecified: Secondary | ICD-10-CM
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
## 2023-08-12 ENCOUNTER — Ambulatory Visit: Payer: Federal, State, Local not specified - PPO | Admitting: Clinical

## 2023-08-12 DIAGNOSIS — F419 Anxiety disorder, unspecified: Secondary | ICD-10-CM
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
## 2023-08-22 DIAGNOSIS — M79641 Pain in right hand: Secondary | ICD-10-CM | POA: Diagnosis not present

## 2023-08-31 DIAGNOSIS — J020 Streptococcal pharyngitis: Secondary | ICD-10-CM | POA: Diagnosis not present

## 2023-09-22 ENCOUNTER — Ambulatory Visit: Admitting: Clinical

## 2023-09-22 DIAGNOSIS — F419 Anxiety disorder, unspecified: Secondary | ICD-10-CM
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
## 2023-10-14 ENCOUNTER — Ambulatory Visit: Admitting: Clinical

## 2023-11-24 ENCOUNTER — Ambulatory Visit: Admitting: Clinical

## 2023-11-24 DIAGNOSIS — F419 Anxiety disorder, unspecified: Secondary | ICD-10-CM
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
## 2023-12-25 ENCOUNTER — Ambulatory Visit: Admitting: Clinical

## 2023-12-25 DIAGNOSIS — F419 Anxiety disorder, unspecified: Secondary | ICD-10-CM
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
## 2024-01-06 ENCOUNTER — Encounter: Payer: Self-pay | Admitting: Gastroenterology

## 2024-01-22 ENCOUNTER — Encounter: Payer: Self-pay | Admitting: *Deleted

## 2024-01-27 ENCOUNTER — Ambulatory Visit (INDEPENDENT_AMBULATORY_CARE_PROVIDER_SITE_OTHER): Admitting: Adult Health

## 2024-01-27 ENCOUNTER — Ambulatory Visit: Payer: Self-pay | Admitting: Adult Health

## 2024-01-27 VITALS — BP 100/64 | HR 67 | Temp 97.6°F | Ht 65.5 in | Wt 137.0 lb

## 2024-01-27 DIAGNOSIS — F909 Attention-deficit hyperactivity disorder, unspecified type: Secondary | ICD-10-CM

## 2024-01-27 DIAGNOSIS — R7303 Prediabetes: Secondary | ICD-10-CM

## 2024-01-27 DIAGNOSIS — Z0000 Encounter for general adult medical examination without abnormal findings: Secondary | ICD-10-CM | POA: Diagnosis not present

## 2024-01-27 DIAGNOSIS — J45998 Other asthma: Secondary | ICD-10-CM

## 2024-01-27 DIAGNOSIS — N951 Menopausal and female climacteric states: Secondary | ICD-10-CM

## 2024-01-27 DIAGNOSIS — F411 Generalized anxiety disorder: Secondary | ICD-10-CM

## 2024-01-27 DIAGNOSIS — E785 Hyperlipidemia, unspecified: Secondary | ICD-10-CM | POA: Diagnosis not present

## 2024-01-27 LAB — CBC WITH DIFFERENTIAL/PLATELET
Basophils Absolute: 0.1 K/uL (ref 0.0–0.1)
Basophils Relative: 0.9 % (ref 0.0–3.0)
Eosinophils Absolute: 0.3 K/uL (ref 0.0–0.7)
Eosinophils Relative: 2.6 % (ref 0.0–5.0)
HCT: 42.8 % (ref 36.0–46.0)
Hemoglobin: 14.4 g/dL (ref 12.0–15.0)
Lymphocytes Relative: 20.5 % (ref 12.0–46.0)
Lymphs Abs: 2 K/uL (ref 0.7–4.0)
MCHC: 33.5 g/dL (ref 30.0–36.0)
MCV: 88.3 fl (ref 78.0–100.0)
Monocytes Absolute: 0.6 K/uL (ref 0.1–1.0)
Monocytes Relative: 6 % (ref 3.0–12.0)
Neutro Abs: 6.8 K/uL (ref 1.4–7.7)
Neutrophils Relative %: 70 % (ref 43.0–77.0)
Platelets: 303 K/uL (ref 150.0–400.0)
RBC: 4.85 Mil/uL (ref 3.87–5.11)
RDW: 14.1 % (ref 11.5–15.5)
WBC: 9.7 K/uL (ref 4.0–10.5)

## 2024-01-27 LAB — COMPREHENSIVE METABOLIC PANEL WITH GFR
ALT: 11 U/L (ref 0–35)
AST: 15 U/L (ref 0–37)
Albumin: 4.4 g/dL (ref 3.5–5.2)
Alkaline Phosphatase: 70 U/L (ref 39–117)
BUN: 18 mg/dL (ref 6–23)
CO2: 28 meq/L (ref 19–32)
Calcium: 9.4 mg/dL (ref 8.4–10.5)
Chloride: 100 meq/L (ref 96–112)
Creatinine, Ser: 0.76 mg/dL (ref 0.40–1.20)
GFR: 96.3 mL/min (ref >60.00)
Glucose, Bld: 84 mg/dL (ref 70–99)
Potassium: 4 meq/L (ref 3.5–5.1)
Sodium: 136 meq/L (ref 135–145)
Total Bilirubin: 1 mg/dL (ref 0.2–1.2)
Total Protein: 7.7 g/dL (ref 6.0–8.3)

## 2024-01-27 LAB — LIPID PANEL
Cholesterol: 189 mg/dL (ref 0–200)
HDL: 73.8 mg/dL (ref >39.00)
LDL Cholesterol: 104 mg/dL — ABNORMAL HIGH (ref 0–99)
NonHDL: 115.17
Total CHOL/HDL Ratio: 3
Triglycerides: 54 mg/dL (ref 0.0–149.0)
VLDL: 10.8 mg/dL (ref 0.0–40.0)

## 2024-01-27 LAB — FOLLICLE STIMULATING HORMONE: FSH: 6.5 m[IU]/mL

## 2024-01-27 LAB — TESTOSTERONE: Testosterone: 25.07 ng/dL (ref 15.00–40.00)

## 2024-01-27 LAB — LUTEINIZING HORMONE: LH: 10.96 m[IU]/mL

## 2024-01-27 LAB — TSH: TSH: 2.47 u[IU]/mL (ref 0.35–5.50)

## 2024-01-27 LAB — HEMOGLOBIN A1C: Hgb A1c MFr Bld: 5.7 % (ref 4.6–6.5)

## 2024-01-27 MED ORDER — AMPHETAMINE-DEXTROAMPHETAMINE 10 MG PO TABS
10.0000 mg | ORAL_TABLET | Freq: Every day | ORAL | 0 refills | Status: AC
Start: 2024-01-27 — End: ?

## 2024-01-27 NOTE — Progress Notes (Signed)
 Subjective:    Patient ID: Rachel Ferrell, female    DOB: [birth year], 43 y.o.   MRN: [id number]  HPI Patient presents for yearly preventative medicine examination. She is a pleasant 43 year old female who  has a past medical history of ADHD, Asthma, Chronic sinusitis, Diverticulosis, GERD (gastroesophageal reflux disease), and Migraine.  Generalized Anxiety Disorder -seen by Dr. Tenny.  She feels as though her symptoms are well-controlled for the most part  Asthma-managed by allergy and asthma with albuterol  rescue inhaler Spiriva 1.25 mcg and Singulair 10 mg nightly.  Her symptoms have been well-controlled  ADHD-was diagnosed by Dr. Sharron.  She was on 10 mg XR but found that she could not sleep when taking this. She did try a family members 10 mg daily dose that was not extended release and did well with this.    Hyperlipidemia - mildly elevated LDL in the past Lab Results  Component Value Date   CHOL 168 09/26/2022   HDL 59.80 09/26/2022   LDLCALC 91 09/26/2022   TRIG 88.0 09/26/2022   CHOLHDL 3 09/26/2022   Prediabetes - she is not currently on medication. She has been working on lifestyle modifications  Lab Results  Component Value Date   HGBA1C 5.6 09/26/2022   HGBA1C 5.8 06/18/2022    She would like to check her hormones for perimenopause. She reports feeling  moody.    All immunizations and health maintenance protocols were reviewed with the patient and needed orders were placed.  Appropriate screening laboratory values were ordered for the patient including screening of hyperlipidemia, renal function and hepatic function.  Medication reconciliation,  past medical history, social history, problem list and allergies were reviewed in detail with the patient  Goals were established with regard to weight loss, exercise, and  diet in compliance with medications Wt Readings from Last 3 Encounters:  01/27/24 137 lb (62.1 kg)  01/12/23 138 lb (62.6 kg)  01/12/23 138  lb (62.6 kg)   She has had her GYN care this year and is scheduled for a repeat colonoscopy due to polyps.   Review of Systems  Constitutional: Negative.   HENT: Negative.    Eyes: Negative.   Respiratory: Negative.    Cardiovascular: Negative.   Gastrointestinal: Negative.   Endocrine: Negative.   Genitourinary: Negative.   Musculoskeletal: Negative.   Skin: Negative.   Allergic/Immunologic: Negative.   Neurological: Negative.   Hematological: Negative.   Psychiatric/Behavioral: Negative.     Past Medical History:  Diagnosis Date   ADHD    Asthma    Chronic sinusitis    Diverticulosis    GERD (gastroesophageal reflux disease)    Migraine     Social History   Socioeconomic History   Marital status: Married    Spouse name: Not on file   Number of children: 2   Years of education: Not on file   Highest education level: Master's degree (e.g., MA, MS, MEng, MEd, MSW, MBA)  Occupational History    Employer: Advertising copywriter    Comment: Audits medical claims  Tobacco Use   Smoking status: Never   Smokeless tobacco: Never  Vaping Use   Vaping status: Never Used  Substance and Sexual Activity   Alcohol use: No   Drug use: No   Sexual activity: Yes    Birth control/protection: None  Other Topics Concern   Not on file  Social History Narrative   Married    Two children 29 and 5 years old.  Social Drivers of Corporate investment banker Strain: Low Risk  (01/27/2024)   Overall Financial Resource Strain (CARDIA)    Difficulty of Paying Living Expenses: Not hard at all  Food Insecurity: No Food Insecurity (01/27/2024)   Hunger Vital Sign    Worried About Running Out of Food in the Last Year: Never true    Ran Out of Food in the Last Year: Never true  Transportation Needs: No Transportation Needs (01/27/2024)   PRAPARE - Administrator, Civil Service (Medical): No    Lack of Transportation (Non-Medical): No  Physical Activity: Sufficiently Active  (01/27/2024)   Exercise Vital Sign    Days of Exercise per Week: 4 days    Minutes of Exercise per Session: 40 min  Stress: No Stress Concern Present (01/27/2024)   [employer] of Occupational Health - Occupational Stress Questionnaire    Feeling of Stress: Only a little  Social Connections: Moderately Integrated (01/27/2024)   Social Connection and Isolation Panel    Frequency of Communication with Friends and Family: Three times a week    Frequency of Social Gatherings with Friends and Family: Once a week    Attends Religious Services: More than 4 times per year    Active Member of [employer] or Organizations: No    Attends Engineer, structural: Not on file    Marital Status: Married  Catering manager Violence: Not At Risk (06/16/2022)   Received from [facility]   HITS    Over the last 12 months how often did your partner physically hurt you?: Never    Over the last 12 months how often did your partner insult you or talk down to you?: Never    Over the last 12 months how often did your partner threaten you with physical harm?: Never    Over the last 12 months how often did your partner scream or curse at you?: Never    Past Surgical History:  Procedure Laterality Date   ABLATION     2017- uterine to reduce periods   NASAL SEPTUM SURGERY     2012   REFRACTIVE SURGERY Bilateral    12/2021   SINUS IRRIGATION      Family History  Problem Relation Age of Onset   Thyroid  disease Mother    Hypertension Father    Dementia Father    Thyroid  disease Father    Drug abuse Brother    Depression Brother    Alcohol abuse Brother    ADD / ADHD Son    Vision loss Maternal Grandmother    Dementia Maternal Grandmother    Dementia Paternal Grandmother    Cancer - Colon Neg Hx    Stomach cancer Neg Hx    Esophageal cancer Neg Hx    Pancreatic cancer Neg Hx    Colon cancer Neg Hx    Rectal cancer Neg Hx     Allergies  Allergen Reactions   Penicillin V Potassium Other (See  Comments)    Current Outpatient Medications on File Prior to Visit  Medication Sig Dispense Refill   Cholecalciferol (VITAMIN D) 2000 units tablet Take 2,000 Units by mouth See admin instructions. Take 1 tablet (2000 units) by mouth daily during winter months and 1 tablet (2000 units) every other day during summer months     fluticasone (FLONASE) 50 MCG/ACT nasal spray Place 1 spray into both nostrils at bedtime.      ipratropium (ATROVENT ) 0.03 % nasal spray Place 2 sprays  into both nostrils every 12 (twelve) hours. 30 mL 4   montelukast (SINGULAIR) 10 MG tablet Take 10 mg by mouth at bedtime.     Multiple Vitamin (MULTIVITAMIN WITH MINERALS) TABS tablet Take 2 tablets by mouth 2 (two) times daily. [place] brand     albuterol  (PROVENTIL  HFA;VENTOLIN  HFA) 108 (90 BASE) MCG/ACT inhaler Inhale 2 puffs into the lungs every 6 (six) hours as needed for wheezing.     dicyclomine  (BENTYL ) 20 MG tablet Take 1 tablet (20 mg total) by mouth every 6 (six) hours as needed (abdominal pain). (Patient not taking: Reported on 01/12/2023) 30 tablet 0   fluconazole  (DIFLUCAN ) 150 MG tablet Take one tablet and then take second tablet 3 days later if needed (Patient not taking: Reported on 01/12/2023) 2 tablet 0   No current facility-administered medications on file prior to visit.    BP 100/64   Pulse 67   Temp 97.6 F (36.4 C) (Oral)   Ht 5' 5.5 (1.664 m)   Wt 137 lb (62.1 kg)   SpO2 97%   BMI 22.45 kg/m       Objective:   Physical Exam Vitals and nursing note reviewed.  Constitutional:      General: She is not in acute distress.    Appearance: Normal appearance. She is not ill-appearing.  HENT:     Head: Normocephalic and atraumatic.     Right Ear: Tympanic membrane, ear canal and external ear normal. There is no impacted cerumen.     Left Ear: Tympanic membrane, ear canal and external ear normal. There is no impacted cerumen.     Nose: Nose normal. No congestion or rhinorrhea.      Mouth/Throat:     Mouth: Mucous membranes are moist.     Pharynx: Oropharynx is clear.  Eyes:     Extraocular Movements: Extraocular movements intact.     Conjunctiva/sclera: Conjunctivae normal.     Pupils: Pupils are equal, round, and reactive to light.  Neck:     Vascular: No carotid bruit.  Cardiovascular:     Rate and Rhythm: Normal rate and regular rhythm.     Pulses: Normal pulses.     Heart sounds: No murmur heard.    No friction rub. No gallop.  Pulmonary:     Effort: Pulmonary effort is normal.     Breath sounds: Normal breath sounds.  Abdominal:     General: Abdomen is flat. Bowel sounds are normal. There is no distension.     Palpations: Abdomen is soft. There is no mass.     Tenderness: There is no abdominal tenderness. There is no guarding or rebound.     Hernia: No hernia is present.  Musculoskeletal:        General: Normal range of motion.     Cervical back: Normal range of motion and neck supple.  Lymphadenopathy:     Cervical: No cervical adenopathy.  Skin:    General: Skin is warm and dry.     Capillary Refill: Capillary refill takes less than 2 seconds.  Neurological:     General: No focal deficit present.     Mental Status: She is alert and oriented to person, place, and time.  Psychiatric:        Mood and Affect: Mood normal.        Behavior: Behavior normal.        Thought Content: Thought content normal.        Judgment: Judgment normal.  Assessment & Plan:  1. Routine general medical examination at a health care facility (Primary) Today patient counseled on age appropriate routine health concerns for screening and prevention, each reviewed and up to date or declined. Immunizations reviewed and up to date or declined. Labs ordered and reviewed. Risk factors for depression reviewed and negative. Hearing function and visual acuity are intact. ADLs screened and addressed as needed. Functional ability and level of safety reviewed and appropriate.  Education, counseling and referrals performed based on assessed risks today. Patient provided with a copy of personalized plan for preventive services. - Follow up in one year or sooner if needed  2. Generalized anxiety disorder - Continue with therapy  - CBC with Differential/Platelet; Future - Comprehensive metabolic panel with GFR; Future - Lipid panel; Future - TSH; Future  3. Asthma, persistent, severity to be determined - Per allergy and asthma  - CBC with Differential/Platelet; Future - Comprehensive metabolic panel with GFR; Future - Lipid panel; Future - TSH; Future  4. Attention deficit hyperactivity disorder (ADHD), unspecified ADHD type - Will switch her to non ER Adderall  - CBC with Differential/Platelet; Future - Comprehensive metabolic panel with GFR; Future - Lipid panel; Future - TSH; Future - amphetamine -dextroamphetamine  (ADDERALL) 10 MG tablet; Take 1 tablet (10 mg total) by mouth daily with breakfast.  Dispense: 30 tablet; Refill: 0 - amphetamine -dextroamphetamine  (ADDERALL) 10 MG tablet; Take 1 tablet (10 mg total) by mouth daily with breakfast.  Dispense: 30 tablet; Refill: 0 - amphetamine -dextroamphetamine  (ADDERALL) 10 MG tablet; Take 1 tablet (10 mg total) by mouth daily with breakfast.  Dispense: 30 tablet; Refill: 0  5. Hyperlipidemia, unspecified hyperlipidemia type - Continue to eat healthy and exercise  - CBC with Differential/Platelet; Future - Comprehensive metabolic panel with GFR; Future - Lipid panel; Future - TSH; Future  6. Prediabetes - Likely at goal.  - CBC with Differential/Platelet; Future - Comprehensive metabolic panel with GFR; Future - Lipid panel; Future - TSH; Future - Hemoglobin A1c; Future  7. Perimenopause  - Estrogens, Total; Future - Testosterone ; Future - Follicle Stimulating Hormone; Future - Luteinizing Hormone; Future  Darleene Shape, NP

## 2024-01-27 NOTE — Patient Instructions (Signed)
 It was great seeing you today   We will follow up with you regarding your lab work   Please let me know if you need anything

## 2024-01-30 LAB — ESTROGENS, TOTAL: Estrogen: 108 pg/mL

## 2024-02-04 ENCOUNTER — Ambulatory Visit: Admitting: Clinical

## 2024-02-04 DIAGNOSIS — F419 Anxiety disorder, unspecified: Secondary | ICD-10-CM
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
## 2024-02-11 DIAGNOSIS — B999 Unspecified infectious disease: Secondary | ICD-10-CM | POA: Diagnosis not present

## 2024-02-11 DIAGNOSIS — J8283 Eosinophilic asthma: Secondary | ICD-10-CM | POA: Diagnosis not present

## 2024-02-11 DIAGNOSIS — J4550 Severe persistent asthma, uncomplicated: Secondary | ICD-10-CM | POA: Diagnosis not present

## 2024-02-11 DIAGNOSIS — J3089 Other allergic rhinitis: Secondary | ICD-10-CM | POA: Diagnosis not present

## 2024-02-16 ENCOUNTER — Encounter

## 2024-02-22 ENCOUNTER — Ambulatory Visit (AMBULATORY_SURGERY_CENTER)

## 2024-02-22 ENCOUNTER — Encounter (INDEPENDENT_AMBULATORY_CARE_PROVIDER_SITE_OTHER): Payer: Self-pay

## 2024-02-22 VITALS — Ht 65.0 in | Wt 140.0 lb

## 2024-02-22 DIAGNOSIS — Z860100 Personal history of colon polyps, unspecified: Secondary | ICD-10-CM

## 2024-02-22 MED ORDER — NA SULFATE-K SULFATE-MG SULF 17.5-3.13-1.6 GM/177ML PO SOLN: 1.0000 | Freq: Once | ORAL | 0 refills | Status: AC

## 2024-02-22 NOTE — Progress Notes (Signed)
 No egg or soy allergy known to patient  No issues known to pt with past sedation with any surgeries or procedures Patient denies ever being told they had issues or difficulty with intubation  No FH of Malignant Hyperthermia  Pt is on diet pills; Takes Mounjaro and hold instructions provided.  Pt is not on  home 02  Pt is not on blood thinners  Pt has intermittent issues with constipation , no meds taken No A fib or A flutter Have any cardiac testing pending--No Pt can ambulate  Pt denies use of chewing tobacco Discussed diabetic I weight loss medication holds Discussed NSAID holds Checked BMI Pt instructed to use Singlecare.com or GoodRx for a price reduction on prep  Patient's chart reviewed by Norleen Schillings CNRA prior to previsit and patient appropriate for the [facility].  Pre visit completed and red dot placed by patient's name on their procedure day (on provider's schedule).

## 2024-03-01 ENCOUNTER — Ambulatory Visit (AMBULATORY_SURGERY_CENTER): Admitting: Gastroenterology

## 2024-03-01 ENCOUNTER — Encounter: Payer: Self-pay | Admitting: Gastroenterology

## 2024-03-01 VITALS — BP 98/62 | HR 74 | Temp 98.1°F | Resp 15 | Ht 65.0 in | Wt 140.0 lb

## 2024-03-01 DIAGNOSIS — Z860101 Personal history of adenomatous and serrated colon polyps: Secondary | ICD-10-CM

## 2024-03-01 DIAGNOSIS — Z860100 Personal history of colon polyps, unspecified: Secondary | ICD-10-CM

## 2024-03-01 DIAGNOSIS — Z1211 Encounter for screening for malignant neoplasm of colon: Secondary | ICD-10-CM | POA: Diagnosis not present

## 2024-03-01 DIAGNOSIS — K5730 Diverticulosis of large intestine without perforation or abscess without bleeding: Secondary | ICD-10-CM | POA: Diagnosis not present

## 2024-03-01 DIAGNOSIS — K635 Polyp of colon: Secondary | ICD-10-CM

## 2024-03-01 DIAGNOSIS — D123 Benign neoplasm of transverse colon: Secondary | ICD-10-CM

## 2024-03-01 DIAGNOSIS — D128 Benign neoplasm of rectum: Secondary | ICD-10-CM

## 2024-03-01 DIAGNOSIS — K621 Rectal polyp: Secondary | ICD-10-CM

## 2024-03-01 DIAGNOSIS — K6389 Other specified diseases of intestine: Secondary | ICD-10-CM | POA: Diagnosis not present

## 2024-03-01 MED ORDER — SODIUM CHLORIDE 0.9 % IV SOLN: 500.0000 mL | Freq: Once | INTRAVENOUS | Status: DC

## 2024-03-01 NOTE — Progress Notes (Signed)
 [facility] History and Physical   Primary Care Physician:  Merna Huxley, NP   Reason for Procedure:   Colon polyp surveillance  Plan:    Surveillance colonoscopy     HPI: Rachel Ferrell is a 43 y.o. female undergoing surveillance colonoscopy.  She has no family history of colon cancer.  She has recurrent uncomplicated diverticulitis.  She had a 10 mm sessile serrated adenoma removed in the ascending colon in Sept 2022 as well as multiple small hyperplastic polyps.    Past Medical History:  Diagnosis Date   ADHD    Asthma    Chronic sinusitis    Diverticulosis    GERD (gastroesophageal reflux disease)    Migraine     Past Surgical History:  Procedure Laterality Date   ABLATION     2017- uterine to reduce periods   NASAL SEPTUM SURGERY     2012   REFRACTIVE SURGERY Bilateral    12/2021   SINUS IRRIGATION      Prior to Admission medications   Medication Sig Start Date End Date Taking? Authorizing Provider  amphetamine -dextroamphetamine  (ADDERALL) 10 MG tablet Take 1 tablet (10 mg total) by mouth daily with breakfast. 01/27/24  Yes Nafziger, Huxley, NP  amphetamine -dextroamphetamine  (ADDERALL) 10 MG tablet Take 1 tablet (10 mg total) by mouth daily with breakfast. 01/27/24  Yes Nafziger, Huxley, NP  amphetamine -dextroamphetamine  (ADDERALL) 10 MG tablet Take 1 tablet (10 mg total) by mouth daily with breakfast. 01/27/24  Yes Nafziger, Huxley, NP  Cholecalciferol (VITAMIN D) 2000 units tablet Take 2,000 Units by mouth See admin instructions. Take 1 tablet (2000 units) by mouth daily during winter months and 1 tablet (2000 units) every other day during summer months   Yes [provider]  fluticasone (FLONASE) 50 MCG/ACT nasal spray Place 1 spray into both nostrils at bedtime.    Yes [provider]  Fluticasone-Umeclidin-Vilant (TRELEGY ELLIPTA) 200-62.5-25 MCG/ACT AEPB Take 1 puff by mouth daily. 01/26/24  Yes [provider]  montelukast  (SINGULAIR) 10 MG tablet Take 10 mg by mouth at bedtime.   Yes [provider]  Multiple Vitamin (MULTIVITAMIN WITH MINERALS) TABS tablet Take 2 tablets by mouth 2 (two) times daily. Rainbow Light brand   Yes [provider]  albuterol  (PROVENTIL  HFA;VENTOLIN  HFA) 108 (90 BASE) MCG/ACT inhaler Inhale 2 puffs into the lungs every 6 (six) hours as needed for wheezing.    [provider]  ipratropium (ATROVENT ) 0.03 % nasal spray Place 2 sprays into both nostrils every 12 (twelve) hours. 01/12/23   Geronimo Amel, MD  tirzepatide (MOUNJARO) 2.5 MG/0.5ML Pen Inject 2.5 mg into the skin once a week. 10/08/23   [provider]    Current Outpatient Medications  Medication Sig Dispense Refill   amphetamine -dextroamphetamine  (ADDERALL) 10 MG tablet Take 1 tablet (10 mg total) by mouth daily with breakfast. 30 tablet 0   amphetamine -dextroamphetamine  (ADDERALL) 10 MG tablet Take 1 tablet (10 mg total) by mouth daily with breakfast. 30 tablet 0   amphetamine -dextroamphetamine  (ADDERALL) 10 MG tablet Take 1 tablet (10 mg total) by mouth daily with breakfast. 30 tablet 0   Cholecalciferol (VITAMIN D) 2000 units tablet Take 2,000 Units by mouth See admin instructions. Take 1 tablet (2000 units) by mouth daily during winter months and 1 tablet (2000 units) every other day during summer months     fluticasone (FLONASE) 50 MCG/ACT nasal spray Place 1 spray into both nostrils at bedtime.      Fluticasone-Umeclidin-Vilant (TRELEGY ELLIPTA) 200-62.5-25  MCG/ACT AEPB Take 1 puff by mouth daily.     montelukast (SINGULAIR) 10 MG tablet Take 10 mg by mouth at bedtime.     Multiple Vitamin (MULTIVITAMIN WITH MINERALS) TABS tablet Take 2 tablets by mouth 2 (two) times daily. Rainbow Light brand     albuterol  (PROVENTIL  HFA;VENTOLIN  HFA) 108 (90 BASE) MCG/ACT inhaler Inhale 2 puffs into the lungs every 6 (six) hours as needed for wheezing.     ipratropium (ATROVENT ) 0.03 % nasal spray  Place 2 sprays into both nostrils every 12 (twelve) hours. 30 mL 4   tirzepatide (MOUNJARO) 2.5 MG/0.5ML Pen Inject 2.5 mg into the skin once a week.     Current Facility-Administered Medications  Medication Dose Route Frequency Provider Last Rate Last Admin   0.9 %  sodium chloride  infusion  500 mL Intravenous Once Stacia Glendia BRAVO, MD        Allergies as of 03/01/2024   (No Known Allergies)    Family History  Problem Relation Age of Onset   Thyroid  disease Mother    Hypertension Father    Dementia Father    Thyroid  disease Father    Drug abuse Brother    Depression Brother    Alcohol abuse Brother    ADD / ADHD Son    Vision loss Maternal Grandmother    Dementia Maternal Grandmother    Dementia Paternal Grandmother    Cancer - Colon Neg Hx    Stomach cancer Neg Hx    Esophageal cancer Neg Hx    Pancreatic cancer Neg Hx    Colon cancer Neg Hx    Rectal cancer Neg Hx     Social History   Socioeconomic History   Marital status: Married    Spouse name: Not on file   Number of children: 2   Years of education: Not on file   Highest education level: Master's degree (e.g., MA, MS, MEng, MEd, MSW, MBA)  Occupational History    Employer: Advertising copywriter    Comment: Audits medical claims  Tobacco Use   Smoking status: Never   Smokeless tobacco: Never  Vaping Use   Vaping status: Never Used  Substance and Sexual Activity   Alcohol use: No   Drug use: No   Sexual activity: Yes    Birth control/protection: None  Other Topics Concern   Not on file  Social History Narrative   Married    Two children 57 and 46 years old.    Social Drivers of Corporate investment banker Strain: Low Risk  (01/27/2024)   Overall Financial Resource Strain (CARDIA)    Difficulty of Paying Living Expenses: Not hard at all  Food Insecurity: No Food Insecurity (01/27/2024)   Hunger Vital Sign    Worried About Running Out of Food in the Last Year: Never true    Ran Out of Food in the  Last Year: Never true  Transportation Needs: No Transportation Needs (01/27/2024)   PRAPARE - Administrator, Civil Service (Medical): No    Lack of Transportation (Non-Medical): No  Physical Activity: Sufficiently Active (01/27/2024)   Exercise Vital Sign    Days of Exercise per Week: 4 days    Minutes of Exercise per Session: 40 min  Stress: No Stress Concern Present (01/27/2024)   [employer] of Occupational Health - Occupational Stress Questionnaire    Feeling of Stress: Only a little  Social Connections: Moderately Integrated (01/27/2024)   Social Connection and Isolation Panel  Frequency of Communication with Friends and Family: Three times a week    Frequency of Social Gatherings with Friends and Family: Once a week    Attends Religious Services: More than 4 times per year    Active Member of [employer] or Organizations: No    Attends Engineer, structural: Not on file    Marital Status: Married  Catering manager Violence: Not At Risk (06/16/2022)   Received from [facility]   HITS    Over the last 12 months how often did your partner physically hurt you?: Never    Over the last 12 months how often did your partner insult you or talk down to you?: Never    Over the last 12 months how often did your partner threaten you with physical harm?: Never    Over the last 12 months how often did your partner scream or curse at you?: Never    Review of Systems:  All other review of systems negative except as mentioned in the HPI.  Physical Exam: Vital signs BP 100/73   Pulse 86   Temp 98.1 F (36.7 C) (Temporal)   Ht 5' 5 (1.651 m)   Wt 140 lb (63.5 kg)   SpO2 99%   BMI 23.30 kg/m   General:   Alert,  Well-developed, well-nourished, pleasant and cooperative in NAD Airway:  Mallampati 1 Lungs:  Clear throughout to auscultation.   Heart:  Regular rate and rhythm; no murmurs, clicks, rubs,  or gallops. Abdomen:  Soft, nontender and nondistended. Normal  bowel sounds.   Neuro/Psych:  Normal mood and affect. A and O x 3   Osman Calzadilla E. Stacia, MD [facility]

## 2024-03-01 NOTE — Op Note (Signed)
 [facility] Patient Name: Rachel Ferrell Procedure Date: 03/01/2024 7:13 AM MRN: [id number] Endoscopist: Glendia E. Stacia , MD, [id number] Age: 43 Referring MD:  Date of Birth: Oct 12, 1980 Gender: Female Account #: [id number] Procedure:                Colonoscopy Indications:              High risk colon cancer surveillance: Personal                            history of sessile serrated colon polyp (10 mm or                            greater in size) Medicines:                Monitored Anesthesia Care Procedure:                Pre-Anesthesia Assessment:                           - Prior to the procedure, a History and Physical                            was performed, and patient medications and                            allergies were reviewed. The patient's tolerance of                            previous anesthesia was also reviewed. The risks                            and benefits of the procedure and the sedation                            options and risks were discussed with the patient.                            All questions were answered, and informed consent                            was obtained. Prior Anticoagulants: The patient has                            taken no anticoagulant or antiplatelet agents. ASA                            Grade Assessment: II - A patient with mild systemic                            disease. After reviewing the risks and benefits,                            the patient was deemed in satisfactory condition to  undergo the procedure.                           After obtaining informed consent, the colonoscope                            was passed under direct vision. Throughout the                            procedure, the patient's blood pressure, pulse, and                            oxygen saturations were monitored continuously. The                            CF [id number] #[id number] was introduced  through the anus                            and advanced to the the cecum, identified by                            appendiceal orifice and ileocecal valve. The                            colonoscopy was performed without difficulty. The                            patient tolerated the procedure well. The quality                            of the bowel preparation was excellent. The                            ileocecal valve, appendiceal orifice, and rectum                            were photographed. The bowel preparation used was                            SUPREP via split dose instruction. Scope In: 8:04:35 AM Scope Out: 8:20:59 AM Scope Withdrawal Time: 0 hours 12 minutes 15 seconds  Total Procedure Duration: 0 hours 16 minutes 24 seconds  Findings:                 The perianal and digital rectal examinations were                            normal. Pertinent negatives include normal                            sphincter tone and no palpable rectal lesions.                           A 7 mm polyp was found in the transverse colon. The  polyp was flat. The polyp was removed with a cold                            snare. Resection and retrieval were complete.                            Estimated blood loss was minimal.                           A 3 mm polyp was found in the proximal rectum. The                            polyp was sessile. The polyp was removed with a                            cold snare. Resection and retrieval were complete.                            Estimated blood loss was minimal.                           A few small-mouthed diverticula were found in the                            sigmoid colon.                           The exam was otherwise normal throughout the                            examined colon.                           The retroflexed view of the distal rectum and anal                            verge was normal and showed  no anal or rectal                            abnormalities. Complications:            No immediate complications. Estimated Blood Loss:     Estimated blood loss was minimal. Impression:               - One 7 mm polyp in the transverse colon, removed                            with a cold snare. Resected and retrieved.                           - One 3 mm polyp in the proximal rectum, removed                            with a cold snare. Resected and retrieved.                           -  Mild diverticulosis in the sigmoid colon.                           - The distal rectum and anal verge are normal on                            retroflexion view. Recommendation:           - Patient has a contact number available for                            emergencies. The signs and symptoms of potential                            delayed complications were discussed with the                            patient. Return to normal activities tomorrow.                            Written discharge instructions were provided to the                            patient.                           - Resume previous diet.                           - Continue present medications.                           - Await pathology results.                           - Repeat colonoscopy in 5 years for surveillance. Demetre Monaco E. Stacia, MD 03/01/2024 8:26:36 AM This report has been signed electronically.

## 2024-03-01 NOTE — Patient Instructions (Signed)
-  Handout on polyps and diverticulosis provided -Await pathology results  OU HAD AN ENDOSCOPIC PROCEDURE TODAY AT THE [facility]:   Refer to the procedure report that was given to you for any specific questions about what was found during the examination.  If the procedure report does not answer your questions, please call your gastroenterologist to clarify.  If you requested that your care partner not be given the details of your procedure findings, then the procedure report has been included in a sealed envelope for you to review at your convenience later.  YOU SHOULD EXPECT: Some feelings of bloating in the abdomen. Passage of more gas than usual.  Walking can help get rid of the air that was put into your GI tract during the procedure and reduce the bloating. If you had a lower endoscopy (such as a colonoscopy or flexible sigmoidoscopy) you may notice spotting of blood in your stool or on the toilet paper. If you underwent a bowel prep for your procedure, you may not have a normal bowel movement for a few days.  Please Note:  You might notice some irritation and congestion in your nose or some drainage.  This is from the oxygen used during your procedure.  There is no need for concern and it should clear up in a day or so.  SYMPTOMS TO REPORT IMMEDIATELY:  Following lower endoscopy (colonoscopy or flexible sigmoidoscopy):  Excessive amounts of blood in the stool  Significant tenderness or worsening of abdominal pains  Swelling of the abdomen that is new, acute  Fever of 100F or higher  For urgent or emergent issues, a gastroenterologist can be reached at any hour by calling [phone]. Do not use MyChart messaging for urgent concerns.    DIET:  We do recommend a small meal at first, but then you may proceed to your regular diet.  Drink plenty of fluids but you should avoid alcoholic beverages for 24 hours.  ACTIVITY:  You should plan to take it easy for the rest of today  and you should NOT DRIVE or use heavy machinery until tomorrow (because of the sedation medicines used during the test).    FOLLOW UP: Our staff will call the number listed on your records the next business day following your procedure.  We will call around 7:15- 8:00 am to check on you and address any questions or concerns that you may have regarding the information given to you following your procedure. If we do not reach you, we will leave a message.     If any biopsies were taken you will be contacted by phone or by letter within the next 1-3 weeks.  Please call us  at [phone] if you have not heard about the biopsies in 3 weeks.    SIGNATURES/CONFIDENTIALITY: You and/or your care partner have signed paperwork which will be entered into your electronic medical record.  These signatures attest to the fact that that the information above on your After Visit Summary has been reviewed and is understood.  Full responsibility of the confidentiality of this discharge information lies with you and/or your care-partner.

## 2024-03-01 NOTE — Progress Notes (Signed)
 A/o x 3, VSS, good SR's, pleased with anesthesia, report to RN

## 2024-03-01 NOTE — Progress Notes (Signed)
 Called to room to assist during endoscopic procedure.  Patient ID and intended procedure confirmed with present staff. Received instructions for my participation in the procedure from the performing physician.

## 2024-03-02 ENCOUNTER — Telehealth: Payer: Self-pay

## 2024-03-02 NOTE — Telephone Encounter (Signed)
  Follow up Call-     03/01/2024    7:12 AM  Call back number  Post procedure Call Back phone  # [phone]  Permission to leave phone message Yes     Patient questions:  Do you have a fever, pain , or abdominal swelling? No. Pain Score  0 *  Have you tolerated food without any problems? Yes.    Have you been able to return to your normal activities? Yes.    Do you have any questions about your discharge instructions: Diet   No. Medications  No. Follow up visit  No.  Do you have questions or concerns about your Care? No.  Actions: * If pain score is 4 or above: No action needed, pain <4.

## 2024-03-03 LAB — SURGICAL PATHOLOGY

## 2024-03-06 ENCOUNTER — Ambulatory Visit: Payer: Self-pay | Admitting: Gastroenterology

## 2024-03-06 NOTE — Progress Notes (Signed)
 Rachel Ferrell,  Good news; neither polyp removed this time was precancerous.  Due to your history of a sessile serrated adenoma >10 mm on your previous colonoscopy, I recommend a repeat colonoscopy in 5 years.

## 2024-03-08 ENCOUNTER — Ambulatory Visit: Admitting: Clinical

## 2024-03-08 DIAGNOSIS — F419 Anxiety disorder, unspecified: Secondary | ICD-10-CM | POA: Diagnosis not present
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
## 2024-03-29 ENCOUNTER — Ambulatory Visit: Admitting: Clinical

## 2024-03-29 DIAGNOSIS — F419 Anxiety disorder, unspecified: Secondary | ICD-10-CM
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
## 2024-04-27 ENCOUNTER — Ambulatory Visit: Admitting: Clinical

## 2024-05-13 ENCOUNTER — Ambulatory Visit: Admitting: Clinical

## 2024-05-13 DIAGNOSIS — F419 Anxiety disorder, unspecified: Secondary | ICD-10-CM
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
## 2024-06-21 ENCOUNTER — Ambulatory Visit: Admitting: Clinical

## 2024-06-21 DIAGNOSIS — F419 Anxiety disorder, unspecified: Secondary | ICD-10-CM | POA: Diagnosis not present
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
## 2024-08-01 ENCOUNTER — Ambulatory Visit: Admitting: Clinical
